# Patient Record
Sex: Female | Born: 1985 | Race: Black or African American | Hispanic: No | Marital: Single | Smoking: Former smoker
Health system: Southern US, Community
[De-identification: ages and names within clinical notes are randomized; demographics above are authoritative.]

## PROBLEM LIST (undated history)

## (undated) DIAGNOSIS — O139 Gestational [pregnancy-induced] hypertension without significant proteinuria, unspecified trimester: Secondary | ICD-10-CM

## (undated) DIAGNOSIS — Z789 Other specified health status: Secondary | ICD-10-CM

## (undated) HISTORY — DX: Other specified health status: Z78.9

## (undated) HISTORY — DX: Gestational (pregnancy-induced) hypertension without significant proteinuria, unspecified trimester: O13.9

## (undated) HISTORY — PX: DILATION AND CURETTAGE OF UTERUS: SHX78

---
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
## 2006-01-16 ENCOUNTER — Emergency Department (HOSPITAL_COMMUNITY): Admission: EM | Admit: 2006-01-16 | Discharge: 2006-01-16 | Payer: Self-pay | Admitting: Emergency Medicine

## 2006-01-17 ENCOUNTER — Ambulatory Visit (HOSPITAL_COMMUNITY): Admission: RE | Admit: 2006-01-17 | Discharge: 2006-01-17 | Payer: Self-pay | Admitting: Emergency Medicine

## 2006-02-02 ENCOUNTER — Encounter (INDEPENDENT_AMBULATORY_CARE_PROVIDER_SITE_OTHER): Payer: Self-pay | Admitting: *Deleted

## 2006-02-02 ENCOUNTER — Emergency Department (HOSPITAL_COMMUNITY): Admission: EM | Admit: 2006-02-02 | Discharge: 2006-02-02 | Payer: Self-pay | Admitting: Emergency Medicine

## 2006-10-12 ENCOUNTER — Emergency Department (HOSPITAL_COMMUNITY): Admission: EM | Admit: 2006-10-12 | Discharge: 2006-10-12 | Payer: Self-pay | Admitting: Emergency Medicine

## 2007-07-29 ENCOUNTER — Emergency Department (HOSPITAL_COMMUNITY): Admission: EM | Admit: 2007-07-29 | Discharge: 2007-07-29 | Payer: Self-pay | Admitting: Emergency Medicine

## 2007-12-31 IMAGING — US US OB COMP LESS 14 WK
1 series · 14 of 28 positions shown · non-contrast
Comparison: none

HISTORY: Bleeding and cramping, pregnant, 6 weeks 2 days EGA by LMP

[Series 1: us ob comp less 14 wk · 0.21mm/px · 14 of 69 slices shown]
[im 3/69]
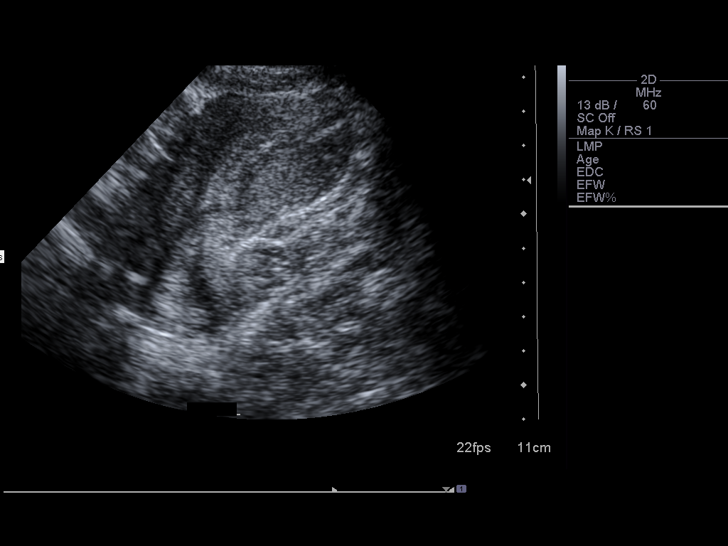
[im 8/69]
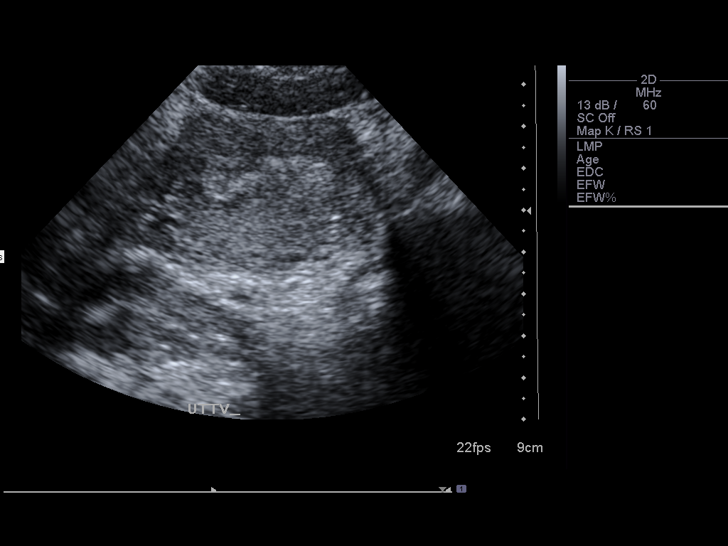
[im 13/69]
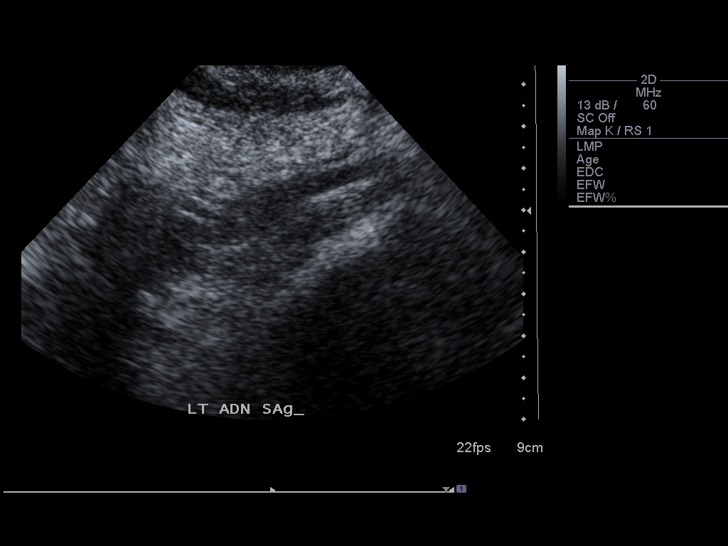
[im 18/69]
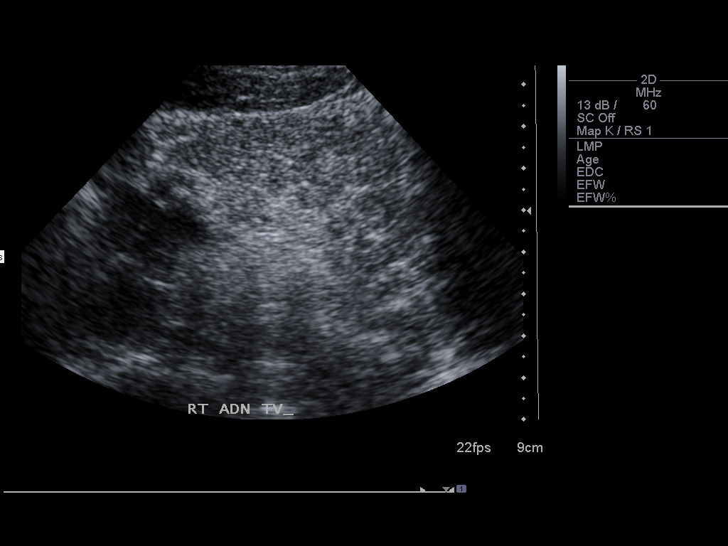
[im 23/69]
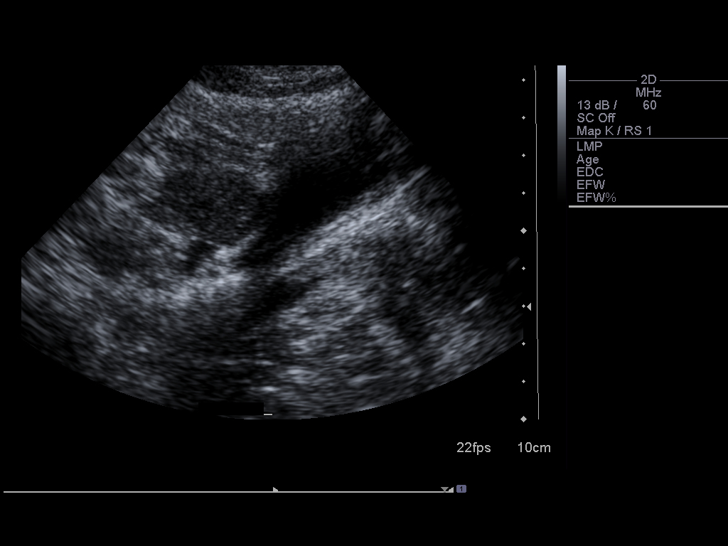
[im 28/69]
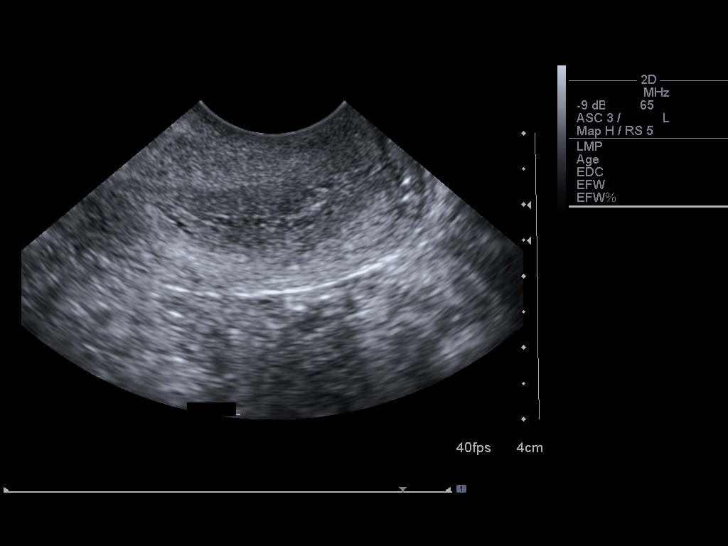
[im 33/69]
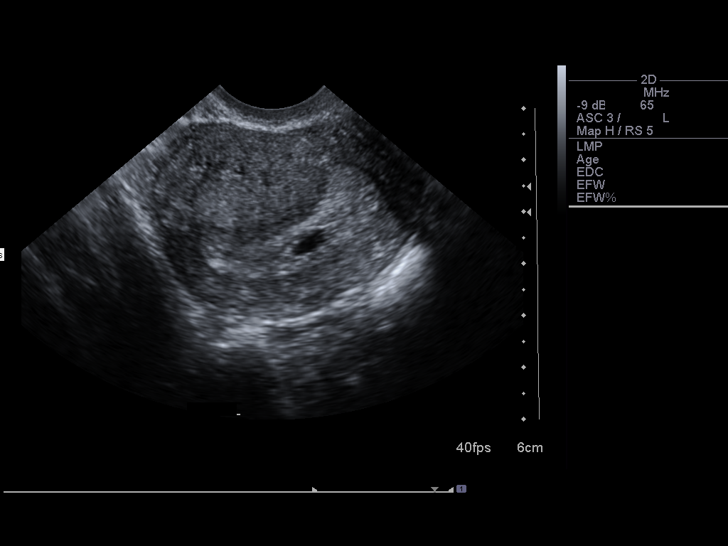
[im 38/69]
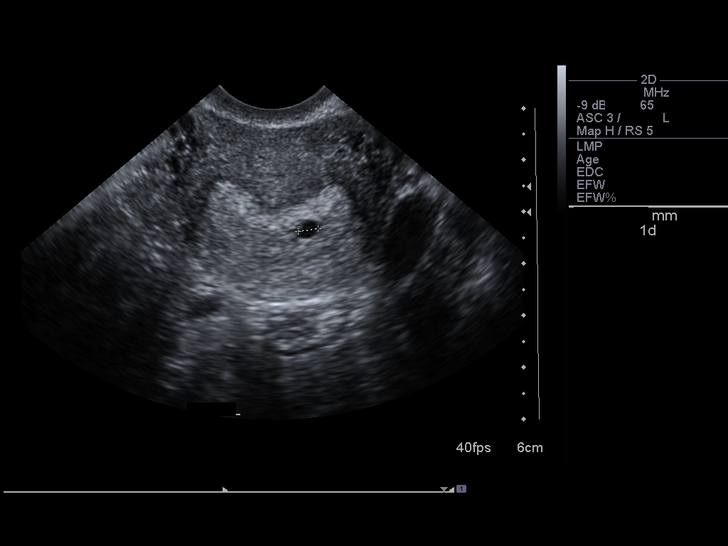
[im 43/69]
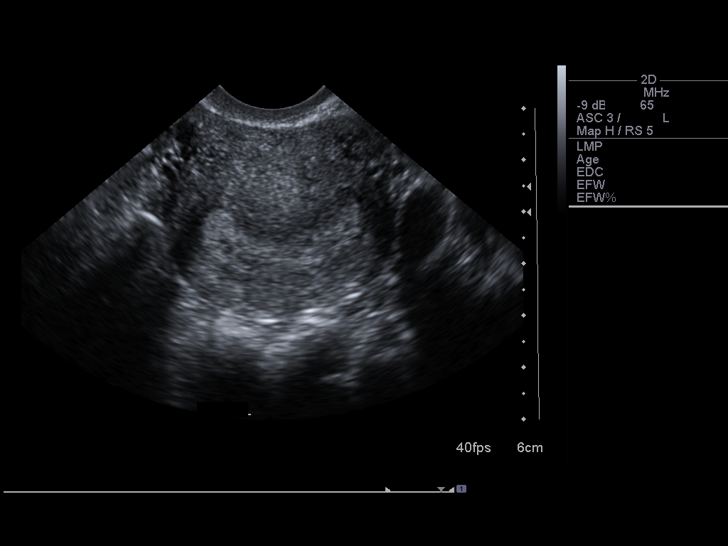
[im 48/69]
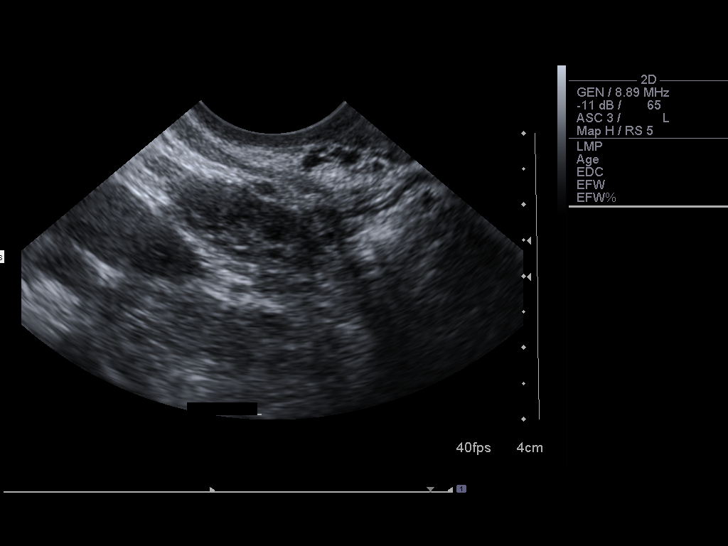
[im 53/69]
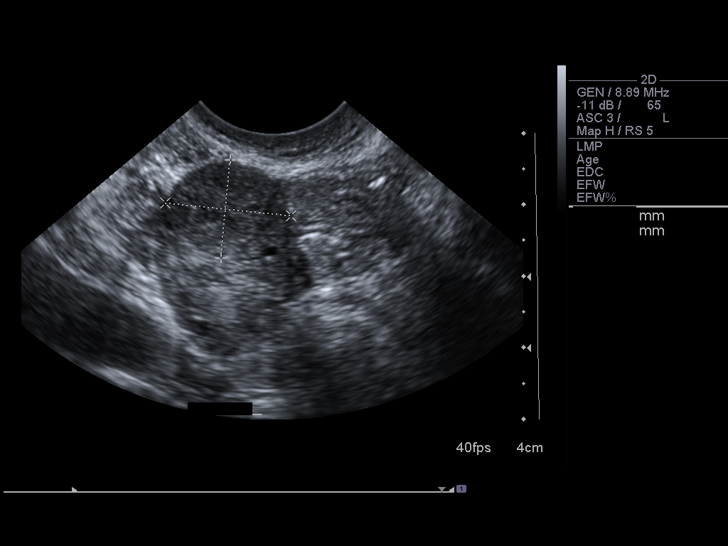
[im 58/69]
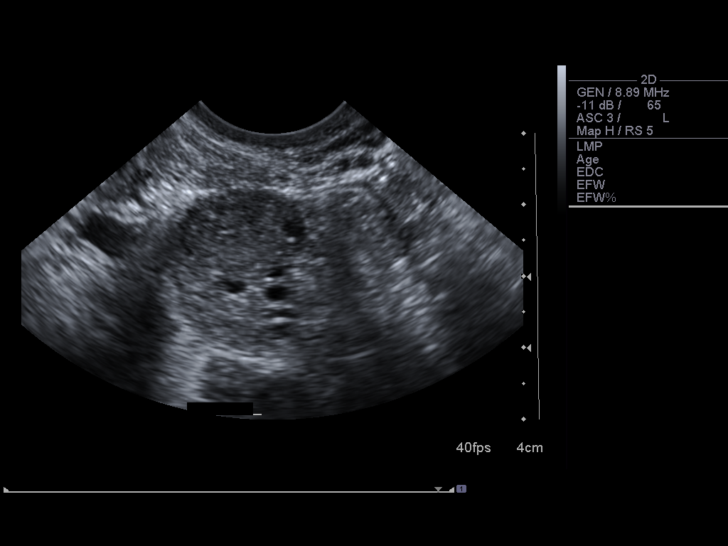
[im 63/69]
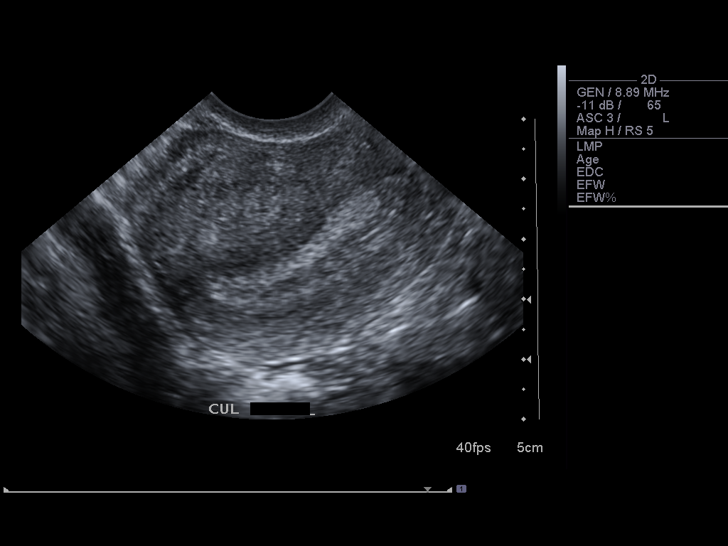
[im 69/69]
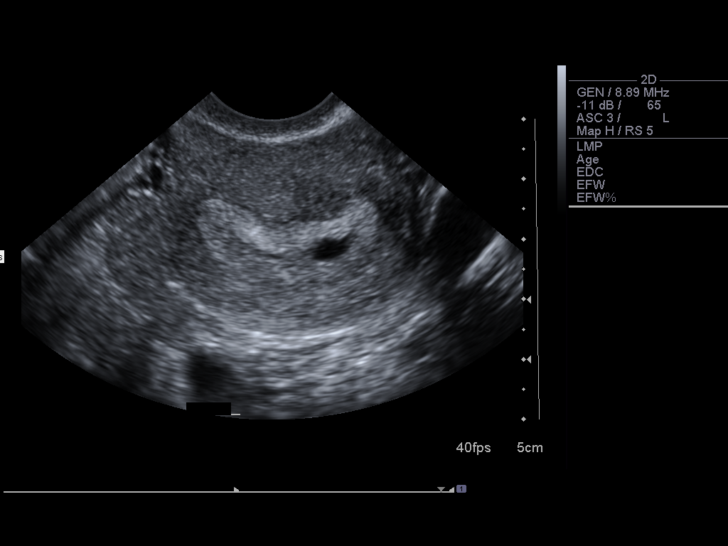

[14 of 28 positions shown; findings below may reference images not displayed]

ULTRASOUND OB COMPLETE LESS THAN 14 WEEKS:
ULTRASOUND OB TRANSVAGINAL MODIFY:

Transabdominal and endovaginal sonography of the gravid pelvis performed.

Tiny gestational sac seen within uterus.
Mean sac diameter 3.3 mm, corresponding to gestational age 5 weeks 0 days.
No fetal pole nor yolk sac visible.
No subchorionic hemorrhage.
Left ovary normal size and morphology, 1.8 x 2.8 x 1.4 cm.
Right ovary larger at 2.8 x 2.0 x 1.9 cm.
Small hemorrhagic corpus luteal cyst right ovary 1.8 x 1.7 x 1.4 cm in size.
No free pelvic fluid or adnexal mass.
IMPRESSION: Tiny gestational sac within uterus corresponding to 5 weeks 0 days EGA.
No fetal pole nor yolk sac identified, unable to assess viability.
Followup ultrasound recommended after 7 to 10 days to assess for fetal
viability.

## 2008-01-29 ENCOUNTER — Other Ambulatory Visit: Admission: RE | Admit: 2008-01-29 | Discharge: 2008-01-29 | Payer: Self-pay | Admitting: Obstetrics and Gynecology

## 2008-07-07 ENCOUNTER — Inpatient Hospital Stay (HOSPITAL_COMMUNITY): Admission: AD | Admit: 2008-07-07 | Discharge: 2008-07-10 | Payer: Self-pay | Admitting: Obstetrics & Gynecology

## 2008-07-07 ENCOUNTER — Ambulatory Visit: Payer: Self-pay | Admitting: Obstetrics and Gynecology

## 2008-07-08 DIAGNOSIS — O4290 Premature rupture of membranes, unspecified as to length of time between rupture and onset of labor, unspecified weeks of gestation: Secondary | ICD-10-CM

## 2009-01-06 ENCOUNTER — Emergency Department (HOSPITAL_COMMUNITY): Admission: EM | Admit: 2009-01-06 | Discharge: 2009-01-06 | Payer: Self-pay | Admitting: Emergency Medicine

## 2009-02-20 ENCOUNTER — Emergency Department (HOSPITAL_COMMUNITY): Admission: EM | Admit: 2009-02-20 | Discharge: 2009-02-20 | Payer: Self-pay | Admitting: Emergency Medicine

## 2009-07-01 ENCOUNTER — Emergency Department (HOSPITAL_COMMUNITY): Admission: EM | Admit: 2009-07-01 | Discharge: 2009-07-01 | Payer: Self-pay | Admitting: Emergency Medicine

## 2009-11-07 ENCOUNTER — Emergency Department (HOSPITAL_COMMUNITY): Admission: EM | Admit: 2009-11-07 | Discharge: 2009-11-07 | Payer: Self-pay | Admitting: Emergency Medicine

## 2009-11-19 ENCOUNTER — Ambulatory Visit: Payer: Self-pay | Admitting: Family Medicine

## 2009-11-19 ENCOUNTER — Encounter: Payer: Self-pay | Admitting: Family Medicine

## 2009-11-19 ENCOUNTER — Inpatient Hospital Stay (HOSPITAL_COMMUNITY): Admission: AD | Admit: 2009-11-19 | Discharge: 2009-11-21 | Payer: Self-pay | Admitting: Family Medicine

## 2010-03-13 ENCOUNTER — Emergency Department (HOSPITAL_COMMUNITY)
Admission: EM | Admit: 2010-03-13 | Discharge: 2010-03-13 | Disposition: A | Discharge status: Home / Self Care | Payer: Self-pay | Attending: Emergency Medicine | Admitting: Emergency Medicine

## 2010-03-13 DIAGNOSIS — R300 Dysuria: Secondary | ICD-10-CM | POA: Insufficient documentation

## 2010-03-13 DIAGNOSIS — N390 Urinary tract infection, site not specified: Secondary | ICD-10-CM | POA: Insufficient documentation

## 2010-03-13 DIAGNOSIS — R319 Hematuria, unspecified: Secondary | ICD-10-CM | POA: Insufficient documentation

## 2010-03-13 LAB — URINALYSIS, ROUTINE W REFLEX MICROSCOPIC
Glucose, UA: NEGATIVE mg/dL
Protein, ur: NEGATIVE mg/dL
Urobilinogen, UA: 0.2 mg/dL (ref 0.0–1.0)
pH: 6 (ref 5.0–8.0)

## 2010-03-13 LAB — URINE MICROSCOPIC-ADD ON

## 2010-03-13 LAB — PREGNANCY, URINE: Preg Test, Ur: NEGATIVE

## 2010-03-15 LAB — URINALYSIS, ROUTINE W REFLEX MICROSCOPIC
Glucose, UA: NEGATIVE mg/dL
Ketones, ur: NEGATIVE mg/dL
Leukocytes, UA: NEGATIVE
Protein, ur: NEGATIVE mg/dL
Specific Gravity, Urine: 1.02 (ref 1.005–1.030)
Urobilinogen, UA: 1 mg/dL (ref 0.0–1.0)

## 2010-03-15 LAB — WET PREP, GENITAL: Yeast Wet Prep HPF POC: NONE SEEN

## 2010-03-15 LAB — GC/CHLAMYDIA PROBE AMP, GENITAL: Chlamydia, DNA Probe: NEGATIVE

## 2010-03-15 LAB — STREP B DNA PROBE

## 2010-03-15 LAB — CBC
MCH: 26.6 pg (ref 26.0–34.0)
MCHC: 33 g/dL (ref 30.0–36.0)
MCV: 80.5 fL (ref 78.0–100.0)
Platelets: 191 10*3/uL (ref 150–400)
RBC: 4.47 MIL/uL (ref 3.87–5.11)

## 2010-03-15 LAB — URINE MICROSCOPIC-ADD ON

## 2010-03-15 LAB — ABO/RH: ABO/RH(D): O POS

## 2010-03-16 LAB — URINE CULTURE: Culture  Setup Time: 201203121210

## 2010-03-20 LAB — URINALYSIS, ROUTINE W REFLEX MICROSCOPIC
Glucose, UA: NEGATIVE mg/dL
Ketones, ur: NEGATIVE mg/dL
Nitrite: NEGATIVE
Protein, ur: NEGATIVE mg/dL

## 2010-03-20 LAB — URINE MICROSCOPIC-ADD ON

## 2010-03-20 LAB — URINE CULTURE: Colony Count: NO GROWTH

## 2010-03-23 LAB — GC/CHLAMYDIA PROBE AMP, GENITAL: Chlamydia, DNA Probe: NEGATIVE

## 2010-03-23 LAB — URINALYSIS, ROUTINE W REFLEX MICROSCOPIC
Glucose, UA: NEGATIVE mg/dL
Protein, ur: NEGATIVE mg/dL
Specific Gravity, Urine: 1.015 (ref 1.005–1.030)

## 2010-03-23 LAB — URINE MICROSCOPIC-ADD ON

## 2010-03-23 LAB — HERPES SIMPLEX VIRUS CULTURE: Culture: DETECTED

## 2010-03-24 ENCOUNTER — Emergency Department (HOSPITAL_COMMUNITY)
Admission: EM | Admit: 2010-03-24 | Discharge: 2010-03-24 | Disposition: A | Discharge status: Home / Self Care | Payer: Self-pay | Attending: Emergency Medicine | Admitting: Emergency Medicine

## 2010-03-24 DIAGNOSIS — F17200 Nicotine dependence, unspecified, uncomplicated: Secondary | ICD-10-CM | POA: Insufficient documentation

## 2010-03-24 DIAGNOSIS — N390 Urinary tract infection, site not specified: Secondary | ICD-10-CM | POA: Insufficient documentation

## 2010-03-24 LAB — POCT PREGNANCY, URINE: Preg Test, Ur: NEGATIVE

## 2010-03-24 LAB — URINALYSIS, ROUTINE W REFLEX MICROSCOPIC
Ketones, ur: NEGATIVE mg/dL
Nitrite: NEGATIVE
Specific Gravity, Urine: >1.03 — ABNORMAL HIGH (ref 1.005–1.030)
pH: 6 (ref 5.0–8.0)

## 2010-03-24 LAB — URINE MICROSCOPIC-ADD ON

## 2010-03-26 LAB — URINE CULTURE

## 2010-04-10 LAB — STREP B DNA PROBE

## 2010-04-10 LAB — CBC
HCT: 33.7 % — ABNORMAL LOW (ref 36.0–46.0)
HCT: 34.5 % — ABNORMAL LOW (ref 36.0–46.0)
Hemoglobin: 11.3 g/dL — ABNORMAL LOW (ref 12.0–15.0)
Hemoglobin: 11.5 g/dL — ABNORMAL LOW (ref 12.0–15.0)
MCHC: 33.6 g/dL (ref 30.0–36.0)
MCV: 78.3 fL (ref 78.0–100.0)
MCV: 78.5 fL (ref 78.0–100.0)
Platelets: 203 10*3/uL (ref 150–400)
RDW: 13.6 % (ref 11.5–15.5)
WBC: 11.5 10*3/uL — ABNORMAL HIGH (ref 4.0–10.5)

## 2010-05-17 NOTE — Op Note (Signed)
Debra Glass, Debra Glass NO.:  [id number]   MEDICAL RECORD NO.:  [id number]          PATIENT TYPE:  INP   LOCATION:  9118                          FACILITY:  [facility]   PHYSICIAN:  Tilda Burrow, M.D. DATE OF BIRTH:  [birth year]   DATE OF PROCEDURE:  07/08/2008  DATE OF DISCHARGE:                               OPERATIVE REPORT   LABOR SUMMARY AND DELIVERY NOTE:  [name] was admitted early on July 07, 2008, with preterm rupture of membranes confirmed.  She had no  contraction, slight cramping, no bleeding.  Membrane rupture was  confirmed at 35 weeks' gestation.  Foley bulb was inserted.  IV  antibiotic prophylaxis due to prematurity was initiated with penicillin.  The patient progressed only slowly with the cervix.  She had cervical  progression with a Foley bulb to 3-4 cm at 5:45 a.m. on July 08, 2008,  and throughout the day, multiple efforts at stretching the cervix were  performed.  Cervix stayed at 4 cm, completely effaced, -1, descended  steadily through the day to 0 to +1 with a tight fibrotic ring, status  post her prior LEEP conization of cervix.  Efforts to disrupt it  included digital exam, speculum exam with ring forceps and so by 5  o'clock, she was 4 cm, 90%, 0 station.  Repeat efforts by me to stretch  her cervix at 5:15 p.m. resulted in disruption of some of the fibrotic  tissues with cervix stretched to 5-6 cm.  Over the next hour and a half,  multiple serial stretchings and exams continued to make steady dilation  of the cervix.  The patient did not tolerate Pitocin due to recurrent  variable decelerations on an intermittent basis.  These were usually  every 30 minutes or less but were quite severe in the 40-60 range with  good recovery and beat-to-beat variability between them.  She was  allowed to progress slowly after 9 cm at 7:05 p.m.  She finally reached  a reducible anterior lip, shortly after 8 p.m.  She had a strong urge to  push but with  pushing, began to have recurrent deep variables with each  contraction.  She was told not to push and taken to the operating room  for vacuum assistance attempt.  She had been found to be in occiput  posterior presentation when she reached reducible anterior lip.  Ultrasound was performed to confirm the fetal body being on the right  side and vertex in an occiput posterior presentation prior to transfer  from the room.  Upon arrival in the OR, the patient had further urge to  push and was found to be crowning and having rotated into an occiput  anterior position.  She progressed quickly with a spontaneous vertex  vaginal delivery of a healthy female infant with Apgars __________ and  9, delivered over an intact perineum with nuchal cord x1, released for  delivery of the fetal body.  The infant was placed under the care of  Pediatrics and Para March presentation of the placenta upon delivery was  noted.  EBL at delivery less than 250 mL,  fundus firm at U-2 after  delivery.      Tilda Burrow, M.D.  Electronically Signed     JVF/MEDQ  D:  07/08/2008  T:  07/09/2008  Job:  [id number]   cc:   [facility]

## 2010-08-26 ENCOUNTER — Encounter: Payer: Self-pay | Admitting: *Deleted

## 2010-08-26 ENCOUNTER — Emergency Department (HOSPITAL_COMMUNITY)
Admission: EM | Admit: 2010-08-26 | Discharge: 2010-08-26 | Disposition: A | Discharge status: Home / Self Care | Payer: Self-pay | Attending: Emergency Medicine | Admitting: Emergency Medicine

## 2010-08-26 ENCOUNTER — Emergency Department (HOSPITAL_COMMUNITY): Payer: Self-pay

## 2010-08-26 DIAGNOSIS — X500XXA Overexertion from strenuous movement or load, initial encounter: Secondary | ICD-10-CM | POA: Insufficient documentation

## 2010-08-26 DIAGNOSIS — S335XXA Sprain of ligaments of lumbar spine, initial encounter: Secondary | ICD-10-CM | POA: Insufficient documentation

## 2010-08-26 MED ORDER — CYCLOBENZAPRINE HCL 10 MG PO TABS: 10.0000 mg | ORAL_TABLET | Freq: Three times a day (TID) | ORAL | Status: AC | PRN

## 2010-08-26 MED ORDER — HYDROCODONE-ACETAMINOPHEN 5-325 MG PO TABS: ORAL_TABLET | ORAL | Status: AC

## 2010-08-26 NOTE — ED Notes (Addendum)
Pt c/o lower back pain x 5 days. Denies injury. Denies urinary symptoms.

## 2010-08-26 NOTE — ED Notes (Deleted)
Pt c/o lower back pain that got worse this am. Pt states that it has been hurting since March. Denies injury.

## 2010-08-26 NOTE — ED Provider Notes (Signed)
History     CSN: [id number] Arrival date & time: 08/26/2010 12:23 PM  Chief Complaint  Patient presents with  . Back Pain   Patient is a 25 y.o. female presenting with back pain. The history is provided by the patient.  Back Pain  This is a new problem. The current episode started more than 2 days ago. The problem occurs constantly. The problem has been gradually worsening. The pain is associated with lifting heavy objects. The pain is present in the lumbar spine. The quality of the pain is described as aching and stabbing. The pain does not radiate. The pain is moderate. The symptoms are aggravated by bending and twisting. The pain is the same all the time. Pertinent negatives include no chest pain, no fever, no numbness, no abdominal pain, no abdominal swelling, no bowel incontinence, no perianal numbness, no bladder incontinence, no dysuria, no pelvic pain, no leg pain, no paresthesias, no tingling and no weakness. She has tried nothing for the symptoms. The treatment provided no relief.    History reviewed. No pertinent past medical history.  History reviewed. No pertinent past surgical history.  History reviewed. No pertinent family history.  History  Substance Use Topics  . Smoking status: Current Everyday Smoker  . Smokeless tobacco: Not on file  . Alcohol Use: Yes     occasionally    OB History    Grav Para Term Preterm Abortions TAB SAB Ect Mult Living                  Review of Systems  Constitutional: Negative for fever.  Cardiovascular: Negative for chest pain.  Gastrointestinal: Negative for abdominal pain and bowel incontinence.  Genitourinary: Negative for bladder incontinence, dysuria and pelvic pain.  Musculoskeletal: Positive for back pain. Negative for myalgias, arthralgias and gait problem.  Skin: Negative.   Neurological: Negative for dizziness, tingling, weakness, numbness and paresthesias.  Hematological: Does not bruise/bleed easily.  All other  systems reviewed and are negative.    Physical Exam  BP 146/81  Pulse 88  Temp(Src) 98.3 F (36.8 C) (Oral)  Resp 18  Ht 5\' 6"  (1.676 m)  Wt 139 lb (63.05 kg)  BMI 22.44 kg/m2  SpO2 100%  Physical Exam  Nursing note and vitals reviewed. Constitutional: She is oriented to person, place, and time. She appears well-developed and well-nourished.  HENT:  Head: Normocephalic and atraumatic.  Mouth/Throat: Oropharynx is clear and moist.  Eyes: EOM are normal. Pupils are equal, round, and reactive to light.  Neck: Normal range of motion. Neck supple.  Cardiovascular: Normal rate, regular rhythm and normal heart sounds.   Pulmonary/Chest: Effort normal and breath sounds normal.  Abdominal: Soft. There is no tenderness. There is no rebound and no guarding.  Musculoskeletal: She exhibits tenderness. She exhibits no edema.       Lumbar back: She exhibits tenderness and pain. She exhibits normal range of motion, no swelling, no spasm and normal pulse.       Back:  Lymphadenopathy:    She has no cervical adenopathy.  Neurological: She is alert and oriented to person, place, and time. She has normal reflexes. No cranial nerve deficit. She exhibits normal muscle tone. Coordination normal.  Skin: Skin is warm and dry.    ED Course  Procedures  MDM    Results for orders placed during the hospital encounter of 03/24/10  POCT PREGNANCY, URINE      Component Value Range   Preg Test, Ur  Value: NEGATIVE            THE SENSITIVITY OF THIS     METHODOLOGY IS >24 mIU/mL  URINALYSIS, ROUTINE W REFLEX MICROSCOPIC      Component Value Range   Color, Urine YELLOW  YELLOW    Appearance HAZY (*) CLEAR    Specific Gravity, Urine >1.030 (*) 1.005 - 1.030    pH 6.0  5.0 - 8.0    Glucose, UA NEGATIVE  NEGATIVE (mg/dL)   Hgb urine dipstick MODERATE (*) NEGATIVE    Bilirubin Urine NEGATIVE  NEGATIVE    Ketones, ur NEGATIVE  NEGATIVE (mg/dL)   Protein, ur 30 (*) NEGATIVE (mg/dL)    Urobilinogen, UA 0.2  0.0 - 1.0 (mg/dL)   Nitrite NEGATIVE  NEGATIVE    Leukocytes, UA SMALL (*) NEGATIVE   URINE MICROSCOPIC-ADD ON      Component Value Range   Squamous Epithelial / LPF RARE  RARE    WBC, UA TOO NUMEROUS TO COUNT  <3 (WBC/hpf)   RBC / HPF 3-6  <3 (RBC/hpf)   Bacteria, UA FEW (*) RARE   URINE CULTURE      Component Value Range   Specimen Description URINE, CLEAN CATCH     Special Requests NONE     Setup Time [id number]     Colony Count >=100,000 COLONIES/ML     Culture       Value: STAPHYLOCOCCUS SPECIES (COAGULASE NEGATIVE)     Note: RIFAMPIN AND GENTAMICIN SHOULD NOT BE USED AS SINGLE DRUGS FOR TREATMENT OF STAPH INFECTIONS.   Report Status 03/26/2010 FINAL     Organism ID, Bacteria STAPHYLOCOCCUS SPECIES (COAGULASE NEGATIVE)       Dg Lumbar Spine Complete  08/26/2010  *RADIOLOGY REPORT*  Clinical Data: Back pain for 4 days  LUMBAR SPINE - COMPLETE 4+ VIEW  Comparison: None  Findings: Five non-rib bearing lumbar vertebrae. Osseous mineralization normal. Vertebral body and disc space heights maintained. No acute fracture, subluxation or bone destruction. No spondylolysis. SI joints symmetric.  IMPRESSION: Normal exam.  Original Report Authenticated By: Lollie Marrow, M.D.         The patient appears reasonably screened and/or stabilized for discharge and I doubt any other medical condition or other [facility] requiring further screening, evaluation, or treatment in the ED at this time prior to discharge.       Dacari Beckstrand L. Nolton Denis, [place] 08/26/10 1421

## 2010-08-26 NOTE — ED Notes (Signed)
Pt a/ox4. Resp even and unlabored. NAD at this time. D/C instructions reviewed with pt. Pt verbalized understanding. Pt ambulated with steady gate to POV. 

## 2010-09-07 NOTE — ED Provider Notes (Signed)
Medical screening examination/treatment/procedure(s) were conducted as a shared visit with non-physician practitioner(s) and myself.  I personally evaluated the patient during the encounter  Adraine Biffle T Shawnise Peterkin, MD 09/07/10 2332 

## 2010-09-30 LAB — URINALYSIS, ROUTINE W REFLEX MICROSCOPIC
Bilirubin Urine: NEGATIVE
Glucose, UA: NEGATIVE
Ketones, ur: 15 — AB
Protein, ur: NEGATIVE

## 2010-09-30 LAB — URINE MICROSCOPIC-ADD ON

## 2010-09-30 LAB — WET PREP, GENITAL
WBC, Wet Prep HPF POC: NONE SEEN
Yeast Wet Prep HPF POC: NONE SEEN

## 2010-10-13 LAB — GC/CHLAMYDIA PROBE AMP, GENITAL: Chlamydia, DNA Probe: NEGATIVE

## 2010-10-13 LAB — PREGNANCY, URINE: Preg Test, Ur: NEGATIVE

## 2010-10-13 LAB — URINALYSIS, ROUTINE W REFLEX MICROSCOPIC
Ketones, ur: NEGATIVE
Nitrite: NEGATIVE
Specific Gravity, Urine: 1.025
Urobilinogen, UA: 1
pH: 6.5

## 2010-10-13 LAB — WET PREP, GENITAL

## 2011-08-08 ENCOUNTER — Other Ambulatory Visit: Payer: Self-pay | Admitting: Nurse Practitioner

## 2011-08-08 ENCOUNTER — Other Ambulatory Visit (HOSPITAL_COMMUNITY)
Admission: RE | Admit: 2011-08-08 | Discharge: 2011-08-08 | Disposition: A | Discharge status: Home / Self Care | Payer: Self-pay | Source: Ambulatory Visit | Attending: Unknown Physician Specialty | Admitting: Unknown Physician Specialty

## 2011-08-08 DIAGNOSIS — R87610 Atypical squamous cells of undetermined significance on cytologic smear of cervix (ASC-US): Secondary | ICD-10-CM | POA: Insufficient documentation

## 2011-08-08 DIAGNOSIS — N870 Mild cervical dysplasia: Secondary | ICD-10-CM | POA: Insufficient documentation

## 2011-12-01 ENCOUNTER — Encounter (HOSPITAL_COMMUNITY): Payer: Self-pay | Admitting: Emergency Medicine

## 2011-12-01 ENCOUNTER — Emergency Department (HOSPITAL_COMMUNITY)
Admission: EM | Admit: 2011-12-01 | Discharge: 2011-12-01 | Disposition: A | Discharge status: Home / Self Care | Payer: Self-pay | Attending: Emergency Medicine | Admitting: Emergency Medicine

## 2011-12-01 ENCOUNTER — Emergency Department (HOSPITAL_COMMUNITY): Payer: Self-pay

## 2011-12-01 DIAGNOSIS — F17200 Nicotine dependence, unspecified, uncomplicated: Secondary | ICD-10-CM | POA: Insufficient documentation

## 2011-12-01 DIAGNOSIS — Z79899 Other long term (current) drug therapy: Secondary | ICD-10-CM | POA: Insufficient documentation

## 2011-12-01 DIAGNOSIS — M79609 Pain in unspecified limb: Secondary | ICD-10-CM | POA: Insufficient documentation

## 2011-12-01 DIAGNOSIS — M79671 Pain in right foot: Secondary | ICD-10-CM

## 2011-12-01 NOTE — ED Provider Notes (Signed)
Medical screening examination/treatment/procedure(s) were performed by non-physician practitioner and as supervising physician I was immediately available for consultation/collaboration.   Owais Pruett, MD 12/01/11 1514 

## 2011-12-01 NOTE — ED Notes (Signed)
Pt c/o r anterior foot pain since yesterday. No pain unless walking or bending. Denies injury. Nad. No swelling or deformity noted. Pedal pulse present.

## 2011-12-01 NOTE — ED Provider Notes (Signed)
History     CSN: [id number]  Arrival date & time 12/01/11  1309   None     Chief Complaint  Patient presents with  . Foot Pain    (Consider location/radiation/quality/duration/timing/severity/associated sxs/prior treatment) HPI Comments: Thinks she may have hit her R foot on something.  Hurts to stand and walk  Patient is a 26 y.o. female presenting with lower extremity pain. The history is provided by the patient. No language interpreter was used.  Foot Pain This is a new problem. The current episode started yesterday. The problem occurs constantly. The symptoms are aggravated by standing and walking. Treatments tried: aspirin. The treatment provided no relief.    History reviewed. No pertinent past medical history.  History reviewed. No pertinent past surgical history.  History reviewed. No pertinent family history.  History  Substance Use Topics  . Smoking status: Current Every Day Smoker  . Smokeless tobacco: Not on file  . Alcohol Use: Yes     Comment: occasionally    OB History    Grav Para Term Preterm Abortions TAB SAB Ect Mult Living                  Review of Systems  Musculoskeletal:       Foot pain   Skin: Negative for wound.  All other systems reviewed and are negative.    Allergies  Review of patient's allergies indicates no known allergies.  Home Medications   Current Outpatient Rx  Name  Route  Sig  Dispense  Refill  . BC FAST PAIN RELIEF PO   Oral   Take 1 packet by mouth every 6 (six) hours. Back pain         . MEDROXYPROGESTERONE ACETATE 150 MG/ML IM SUSP   Intramuscular   Inject 150 mg into the muscle every 3 (three) months.         . ADULT MULTIVITAMIN W/MINERALS CH   Oral   Take 1 tablet by mouth daily.           BP 136/81  Pulse 99  Temp 98.5 F (36.9 C) (Oral)  Resp 16  Ht 5\' 5"  (1.651 m)  Wt 136 lb (61.689 kg)  BMI 22.63 kg/m2  SpO2 100%  Physical Exam  Nursing note and vitals reviewed. Constitutional:  She is oriented to person, place, and time. She appears well-developed and well-nourished. No distress.  HENT:  Head: Normocephalic and atraumatic.  Eyes: EOM are normal.  Neck: Normal range of motion.  Cardiovascular: Normal rate and regular rhythm.   Pulmonary/Chest: Effort normal.  Abdominal: Soft. She exhibits no distension. There is no tenderness.  Musculoskeletal: She exhibits tenderness.       Right foot: She exhibits decreased range of motion and tenderness. She exhibits no swelling, no crepitus, no deformity and no laceration.       Feet:  Neurological: She is alert and oriented to person, place, and time.  Skin: Skin is warm and dry.  Psychiatric: She has a normal mood and affect. Judgment normal.    ED Course  Procedures (including critical care time)  Labs Reviewed - No data to display Dg Foot Complete Right  12/01/2011  *RADIOLOGY REPORT*  Clinical Data: Foot pain  RIGHT FOOT COMPLETE - 3+ VIEW  Comparison: None.  Findings: Three views of the right foot submitted.  No acute fracture or subluxation.  No radiopaque foreign body.  IMPRESSION: No acute fracture or subluxation.   Original Report Authenticated By: Natasha Mead, M.D.  1. Right foot pain       MDM  Ibuprofen 600 mg TID Post op shoe Crutches Ice, elevation F/u dr. Hilda Lias prn        Evalina Field, PA 12/01/11 1421

## 2012-08-08 IMAGING — CR DG LUMBAR SPINE COMPLETE 4+V
5 series · 5 of 5 positions shown · non-contrast
Comparison: None

CLINICAL DATA: Back pain for 4 days

LUMBAR SPINE - COMPLETE 4+ VIEW

[view not recorded (1 of 5)]
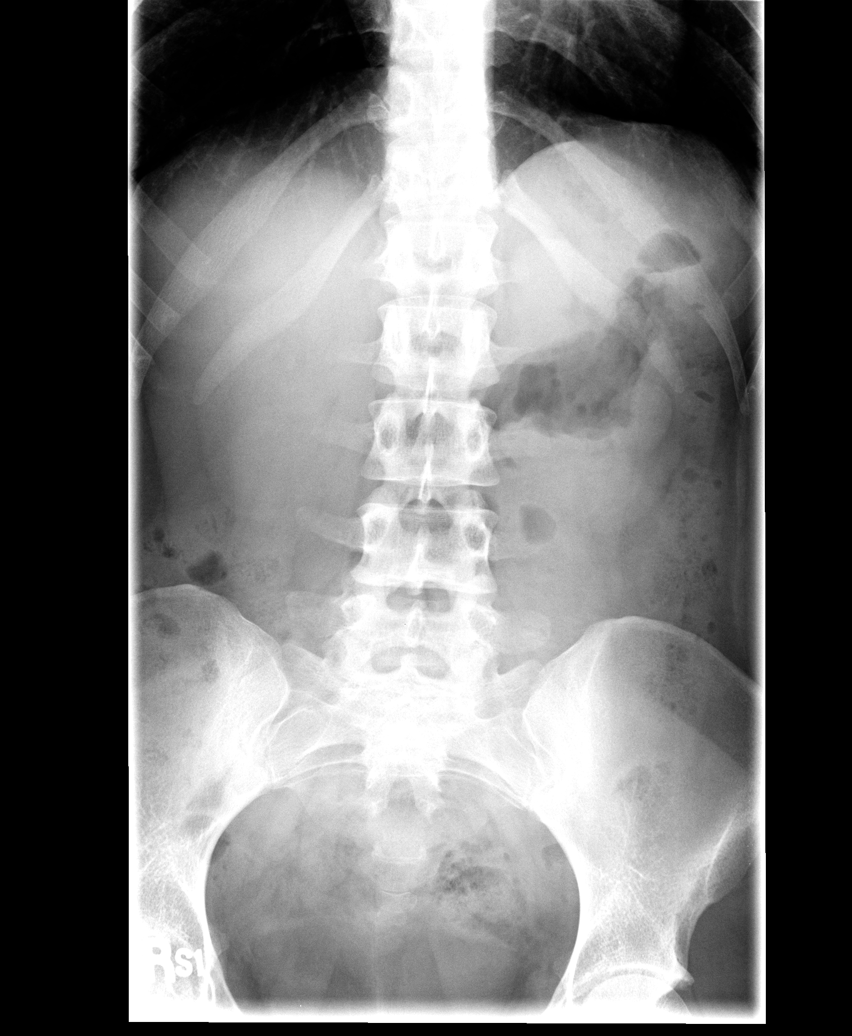

[view not recorded (2 of 5)]
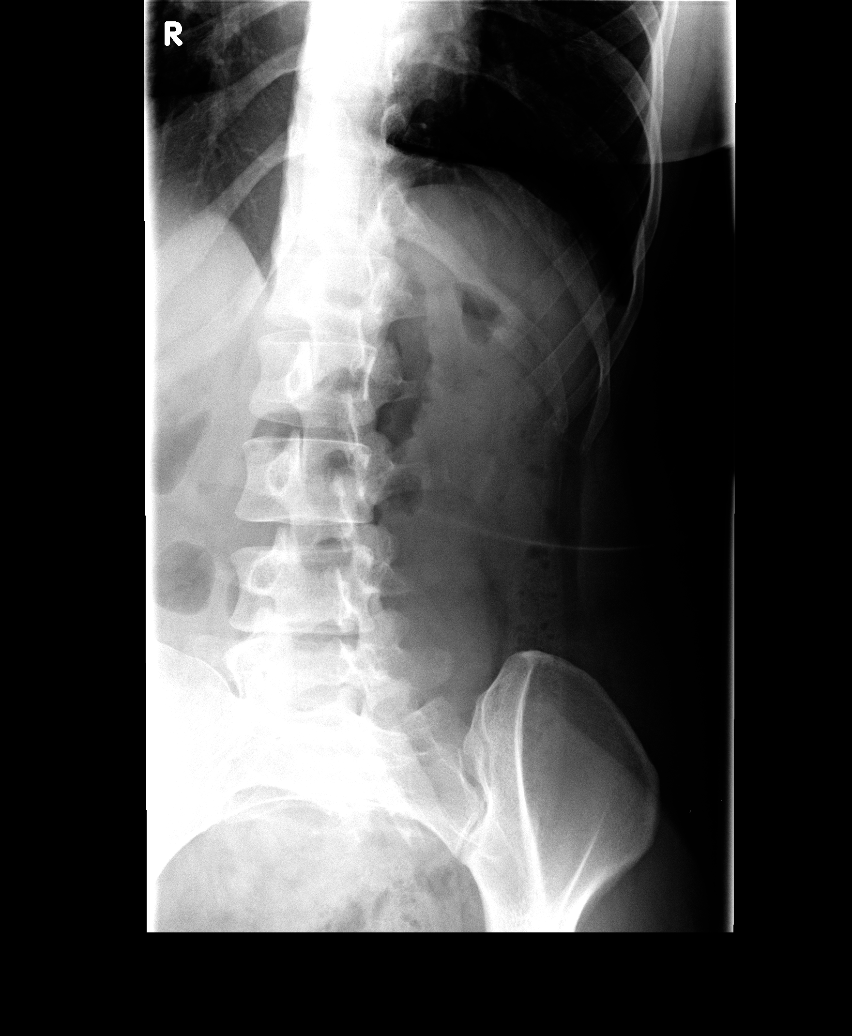

[view not recorded (3 of 5)]
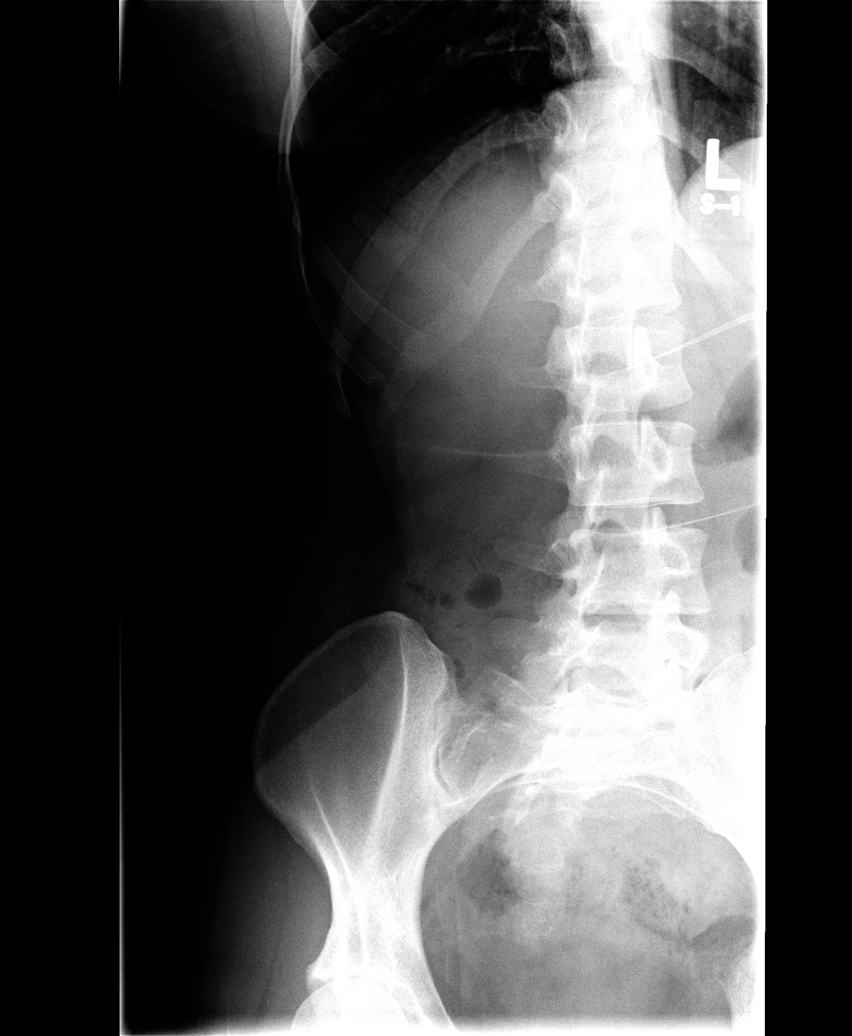

[view not recorded (4 of 5)]
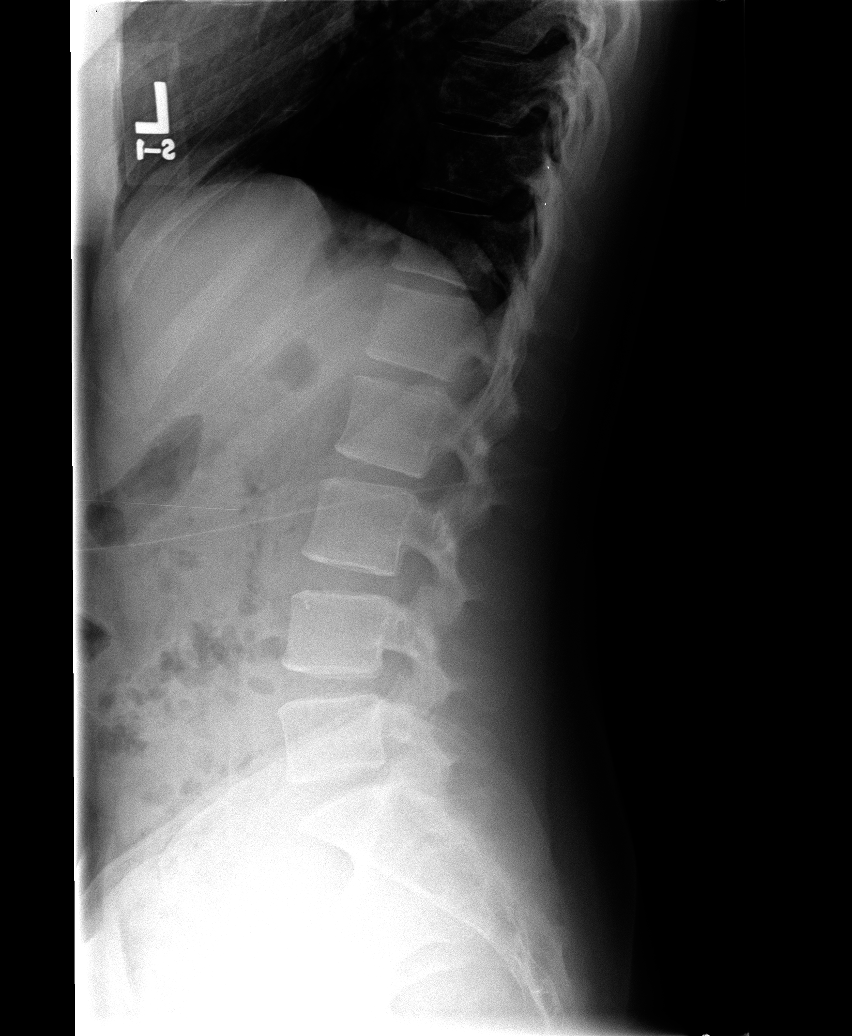

[view not recorded (5 of 5)]
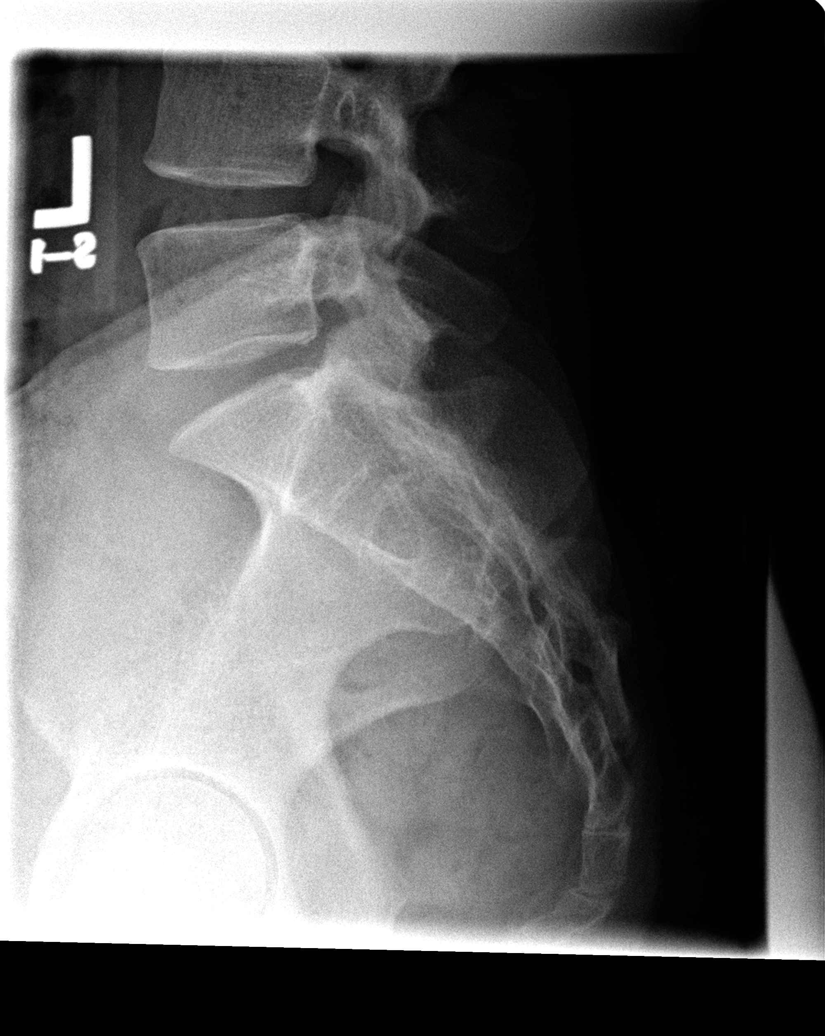

[5 of 5 positions shown; findings below may reference images not displayed]

FINDINGS: Five non-rib bearing lumbar vertebrae.
Osseous mineralization normal.
Vertebral body and disc space heights maintained.
No acute fracture, subluxation or bone destruction.
No spondylolysis.
SI joints symmetric.
IMPRESSION: Normal exam.

## 2019-12-31 ENCOUNTER — Ambulatory Visit (INDEPENDENT_AMBULATORY_CARE_PROVIDER_SITE_OTHER): Payer: Medicaid Other | Admitting: Adult Health

## 2019-12-31 ENCOUNTER — Other Ambulatory Visit: Payer: Self-pay

## 2019-12-31 ENCOUNTER — Encounter: Payer: Self-pay | Admitting: Adult Health

## 2019-12-31 VITALS — BP 142/92 | HR 86 | Ht 66.0 in | Wt 151.0 lb

## 2019-12-31 DIAGNOSIS — Z3201 Encounter for pregnancy test, result positive: Secondary | ICD-10-CM | POA: Diagnosis not present

## 2019-12-31 DIAGNOSIS — Z3A01 Less than 8 weeks gestation of pregnancy: Secondary | ICD-10-CM | POA: Insufficient documentation

## 2019-12-31 DIAGNOSIS — O3680X0 Pregnancy with inconclusive fetal viability, not applicable or unspecified: Secondary | ICD-10-CM | POA: Diagnosis not present

## 2019-12-31 LAB — POCT URINE PREGNANCY: Preg Test, Ur: POSITIVE — AB

## 2019-12-31 NOTE — Patient Instructions (Signed)
First Trimester of Pregnancy The first trimester of pregnancy is from week 1 until the end of week 13 (months 1 through 3). A week after a sperm fertilizes an egg, the egg will implant on the wall of the uterus. This embryo will begin to develop into a baby. Genes from you and your partner will form the baby. The female genes will determine whether the baby will be a boy or a girl. At 6-8 weeks, the eyes and face will be formed, and the heartbeat can be seen on ultrasound. At the end of 12 weeks, all the baby's organs will be formed. Now that you are pregnant, you will want to do everything you can to have a healthy baby. Two of the most important things are to get good prenatal care and to follow your health care provider's instructions. Prenatal care is all the medical care you receive before the baby's birth. This care will help prevent, find, and treat any problems during the pregnancy and childbirth. Body changes during your first trimester Your body goes through many changes during pregnancy. The changes vary from woman to woman.  You may gain or lose a couple of pounds at first.  You may feel sick to your stomach (nauseous) and you may throw up (vomit). If the vomiting is uncontrollable, call your health care provider.  You may tire easily.  You may develop headaches that can be relieved by medicines. All medicines should be approved by your health care provider.  You may urinate more often. Painful urination may mean you have a bladder infection.  You may develop heartburn as a result of your pregnancy.  You may develop constipation because certain hormones are causing the muscles that push stool through your intestines to slow down.  You may develop hemorrhoids or swollen veins (varicose veins).  Your breasts may begin to grow larger and become tender. Your nipples may stick out more, and the tissue that surrounds them (areola) may become darker.  Your gums may bleed and may be  sensitive to brushing and flossing.  Dark spots or blotches (chloasma, mask of pregnancy) may develop on your face. This will likely fade after the baby is born.  Your menstrual periods will stop.  You may have a loss of appetite.  You may develop cravings for certain kinds of food.  You may have changes in your emotions from day to day, such as being excited to be pregnant or being concerned that something may go wrong with the pregnancy and baby.  You may have more vivid and strange dreams.  You may have changes in your hair. These can include thickening of your hair, rapid growth, and changes in texture. Some women also have hair loss during or after pregnancy, or hair that feels dry or thin. Your hair will most likely return to normal after your baby is born. What to expect at prenatal visits During a routine prenatal visit:  You will be weighed to make sure you and the baby are growing normally.  Your blood pressure will be taken.  Your abdomen will be measured to track your baby's growth.  The fetal heartbeat will be listened to between weeks 10 and 14 of your pregnancy.  Test results from any previous visits will be discussed. Your health care provider may ask you:  How you are feeling.  If you are feeling the baby move.  If you have had any abnormal symptoms, such as leaking fluid, bleeding, severe headaches, or abdominal   cramping.  If you are using any tobacco products, including cigarettes, chewing tobacco, and electronic cigarettes.  If you have any questions. Other tests that may be performed during your first trimester include:  Blood tests to find your blood type and to check for the presence of any previous infections. The tests will also be used to check for low iron levels (anemia) and protein on red blood cells (Rh antibodies). Depending on your risk factors, or if you previously had diabetes during pregnancy, you may have tests to check for high blood sugar  that affects pregnant women (gestational diabetes).  Urine tests to check for infections, diabetes, or protein in the urine.  An ultrasound to confirm the proper growth and development of the baby.  Fetal screens for spinal cord problems (spina bifida) and Down syndrome.  HIV (human immunodeficiency virus) testing. Routine prenatal testing includes screening for HIV, unless you choose not to have this test.  You may need other tests to make sure you and the baby are doing well. Follow these instructions at home: Medicines  Follow your health care provider's instructions regarding medicine use. Specific medicines may be either safe or unsafe to take during pregnancy.  Take a prenatal vitamin that contains at least 600 micrograms (mcg) of folic acid.  If you develop constipation, try taking a stool softener if your health care provider approves. Eating and drinking   Eat a balanced diet that includes fresh fruits and vegetables, whole grains, good sources of protein such as meat, eggs, or tofu, and low-fat dairy. Your health care provider will help you determine the amount of weight gain that is right for you.  Avoid raw meat and uncooked cheese. These carry germs that can cause birth defects in the baby.  Eating four or five small meals rather than three large meals a day may help relieve nausea and vomiting. If you start to feel nauseous, eating a few soda crackers can be helpful. Drinking liquids between meals, instead of during meals, also seems to help ease nausea and vomiting.  Limit foods that are high in fat and processed sugars, such as fried and sweet foods.  To prevent constipation: ? Eat foods that are high in fiber, such as fresh fruits and vegetables, whole grains, and beans. ? Drink enough fluid to keep your urine clear or pale yellow. Activity  Exercise only as directed by your health care provider. Most women can continue their usual exercise routine during  pregnancy. Try to exercise for 30 minutes at least 5 days a week. Exercising will help you: ? Control your weight. ? Stay in shape. ? Be prepared for labor and delivery.  Experiencing pain or cramping in the lower abdomen or lower back is a good sign that you should stop exercising. Check with your health care provider before continuing with normal exercises.  Try to avoid standing for long periods of time. Move your legs often if you must stand in one place for a long time.  Avoid heavy lifting.  Wear low-heeled shoes and practice good posture.  You may continue to have sex unless your health care provider tells you not to. Relieving pain and discomfort  Wear a good support bra to relieve breast tenderness.  Take warm sitz baths to soothe any pain or discomfort caused by hemorrhoids. Use hemorrhoid cream if your health care provider approves.  Rest with your legs elevated if you have leg cramps or low back pain.  If you develop varicose veins in   your legs, wear support hose. Elevate your feet for 15 minutes, 3-4 times a day. Limit salt in your diet. Prenatal care  Schedule your prenatal visits by the twelfth week of pregnancy. They are usually scheduled monthly at first, then more often in the last 2 months before delivery.  Write down your questions. Take them to your prenatal visits.  Keep all your prenatal visits as told by your health care provider. This is important. Safety  Wear your seat belt at all times when driving.  Make a list of emergency phone numbers, including numbers for family, friends, the hospital, and police and fire departments. General instructions  Ask your health care provider for a referral to a local prenatal education class. Begin classes no later than the beginning of month 6 of your pregnancy.  Ask for help if you have counseling or nutritional needs during pregnancy. Your health care provider can offer advice or refer you to specialists for help  with various needs.  Do not use hot tubs, steam rooms, or saunas.  Do not douche or use tampons or scented sanitary pads.  Do not cross your legs for long periods of time.  Avoid cat litter boxes and soil used by cats. These carry germs that can cause birth defects in the baby and possibly loss of the fetus by miscarriage or stillbirth.  Avoid all smoking, herbs, alcohol, and medicines not prescribed by your health care provider. Chemicals in these products affect the formation and growth of the baby.  Do not use any products that contain nicotine or tobacco, such as cigarettes and e-cigarettes. If you need help quitting, ask your health care provider. You may receive counseling support and other resources to help you quit.  Schedule a dentist appointment. At home, brush your teeth with a soft toothbrush and be gentle when you floss. Contact a health care provider if:  You have dizziness.  You have mild pelvic cramps, pelvic pressure, or nagging pain in the abdominal area.  You have persistent nausea, vomiting, or diarrhea.  You have a bad smelling vaginal discharge.  You have pain when you urinate.  You notice increased swelling in your face, hands, legs, or ankles.  You are exposed to fifth disease or chickenpox.  You are exposed to German measles (rubella) and have never had it. Get help right away if:  You have a fever.  You are leaking fluid from your vagina.  You have spotting or bleeding from your vagina.  You have severe abdominal cramping or pain.  You have rapid weight gain or loss.  You vomit blood or material that looks like coffee grounds.  You develop a severe headache.  You have shortness of breath.  You have any kind of trauma, such as from a fall or a car accident. Summary  The first trimester of pregnancy is from week 1 until the end of week 13 (months 1 through 3).  Your body goes through many changes during pregnancy. The changes vary from  woman to woman.  You will have routine prenatal visits. During those visits, your health care provider will examine you, discuss any test results you may have, and talk with you about how you are feeling. This information is not intended to replace advice given to you by your health care provider. Make sure you discuss any questions you have with your health care provider. Document Revised: 12/01/2016 Document Reviewed: 12/01/2015 Elsevier Patient Education  2020 [employer]  

## 2019-12-31 NOTE — Progress Notes (Signed)
  Subjective:     Patient ID: Debra Glass, female   DOB: 1985-01-17, 34 y.o.   MRN: [id number]  HPI Emilie is a 33 year old black female,single, in for UPT has missed a period and had 2+HPTs. She has 10 and 34 yo girls. She is working at [employer].  Review of Systems +missed period with 2+HPTs Reviewed past medical,surgical, social and family history. Reviewed medications and allergies.     Objective:   Physical Exam BP (!) 142/92 (BP Location: Left Arm, Patient Position: Sitting, Cuff Size: Normal)   Pulse 86   Ht 5\' 6"  (1.676 m)   Wt 151 lb (68.5 kg)   LMP 11/08/2019   BMI 24.37 kg/m UPT is +, about 7+4 weeks by LMP with EDD 8/13/222.Skin warm and dry. Neck: mid line trachea, normal thyroid, good ROM, no lymphadenopathy noted. Lungs: clear to ausculation bilaterally. Cardiovascular: regular rate and rhythm.Abdomen is soft and non tender. AA is 0  Fall risk is low PHQ 9 scor eis 3, no SI, GAD 7 is 0  Upstream - 12/31/19 1014      Pregnancy Intention Screening   Does the patient want to become pregnant in the next year? N/A    Does the patient's partner want to become pregnant in the next year? N/A    Would the patient like to discuss contraceptive options today? N/A      Contraception Wrap Up   Current Method Pregnant/Seeking Pregnancy    End Method Pregnant/Seeking Pregnancy    Contraception Counseling Provided No              Assessment:     1. Pregnancy examination or test, positive result Continue PNV   2. Less than [redacted] weeks gestation of pregnancy  3. Encounter to determine fetal viability of pregnancy, single or unspecified fetus Return in 1-2 weeks for dating 01/02/20    Plan:     Review handout on first trimester and by [facility]

## 2020-01-14 ENCOUNTER — Telehealth: Payer: Self-pay | Admitting: Adult Health

## 2020-01-14 ENCOUNTER — Other Ambulatory Visit: Payer: Medicaid Other

## 2020-01-14 NOTE — Telephone Encounter (Signed)
Pt tested positive for Covid 01/13/2020 - wonders what can she do at home for her symptoms Her dating U/S was rescheduled  Please advise & notify pt    [address]

## 2020-01-14 NOTE — Telephone Encounter (Signed)
Returned pt's call. Confirmed that pt had MyChart access. Sent pt a msg on MyChart listing medications that are safe to take during pregnancy to alleviate her Covid symptoms. Pt confirms understanding.

## 2020-01-27 ENCOUNTER — Other Ambulatory Visit: Payer: Medicaid Other

## 2020-01-28 ENCOUNTER — Other Ambulatory Visit: Payer: Self-pay

## 2020-01-28 ENCOUNTER — Other Ambulatory Visit: Payer: Medicaid Other

## 2020-01-28 ENCOUNTER — Ambulatory Visit (INDEPENDENT_AMBULATORY_CARE_PROVIDER_SITE_OTHER): Payer: Medicaid Other

## 2020-01-28 ENCOUNTER — Other Ambulatory Visit: Payer: Self-pay | Admitting: Adult Health

## 2020-01-28 DIAGNOSIS — Z1379 Encounter for other screening for genetic and chromosomal anomalies: Secondary | ICD-10-CM

## 2020-01-28 DIAGNOSIS — Z3682 Encounter for antenatal screening for nuchal translucency: Secondary | ICD-10-CM

## 2020-01-28 DIAGNOSIS — O3680X0 Pregnancy with inconclusive fetal viability, not applicable or unspecified: Secondary | ICD-10-CM | POA: Diagnosis not present

## 2020-01-28 DIAGNOSIS — Z3481 Encounter for supervision of other normal pregnancy, first trimester: Secondary | ICD-10-CM

## 2020-01-28 NOTE — Progress Notes (Signed)
[place] 11+4 wks,single IUP,FHR 167 bpm,anterior placenta,normal ovaries,NB present, NT 1.3 mm

## 2020-02-12 ENCOUNTER — Encounter: Payer: Medicaid Other | Admitting: Advanced Practice Midwife

## 2020-02-12 ENCOUNTER — Ambulatory Visit: Payer: Medicaid Other | Admitting: *Deleted

## 2020-02-12 ENCOUNTER — Encounter: Payer: Self-pay | Admitting: Medical

## 2020-02-12 DIAGNOSIS — O0993 Supervision of high risk pregnancy, unspecified, third trimester: Secondary | ICD-10-CM | POA: Insufficient documentation

## 2020-02-12 DIAGNOSIS — Z3490 Encounter for supervision of normal pregnancy, unspecified, unspecified trimester: Secondary | ICD-10-CM | POA: Insufficient documentation

## 2020-02-12 DIAGNOSIS — O09891 Supervision of other high risk pregnancies, first trimester: Secondary | ICD-10-CM | POA: Insufficient documentation

## 2020-02-13 ENCOUNTER — Ambulatory Visit: Payer: Medicaid Other | Admitting: *Deleted

## 2020-02-13 ENCOUNTER — Other Ambulatory Visit (HOSPITAL_COMMUNITY)
Admission: RE | Admit: 2020-02-13 | Discharge: 2020-02-13 | Disposition: A | Discharge status: Home / Self Care | Payer: Medicaid Other | Source: Ambulatory Visit | Attending: Medical | Admitting: Medical

## 2020-02-13 ENCOUNTER — Encounter: Payer: Self-pay | Admitting: Medical

## 2020-02-13 ENCOUNTER — Ambulatory Visit (INDEPENDENT_AMBULATORY_CARE_PROVIDER_SITE_OTHER): Payer: Medicaid Other | Admitting: Medical

## 2020-02-13 ENCOUNTER — Other Ambulatory Visit: Payer: Self-pay

## 2020-02-13 VITALS — BP 117/83 | HR 86 | Wt 158.0 lb

## 2020-02-13 DIAGNOSIS — Z8751 Personal history of pre-term labor: Secondary | ICD-10-CM | POA: Insufficient documentation

## 2020-02-13 DIAGNOSIS — Z3480 Encounter for supervision of other normal pregnancy, unspecified trimester: Secondary | ICD-10-CM | POA: Diagnosis not present

## 2020-02-13 DIAGNOSIS — O09891 Supervision of other high risk pregnancies, first trimester: Secondary | ICD-10-CM

## 2020-02-13 DIAGNOSIS — Z3A13 13 weeks gestation of pregnancy: Secondary | ICD-10-CM

## 2020-02-13 LAB — POCT URINALYSIS DIPSTICK OB
Blood, UA: NEGATIVE
Glucose, UA: NEGATIVE
Ketones, UA: NEGATIVE
Leukocytes, UA: NEGATIVE
Nitrite, UA: NEGATIVE
POC,PROTEIN,UA: NEGATIVE

## 2020-02-13 MED ORDER — BLOOD PRESSURE MONITOR MISC: 0 refills | Status: DC

## 2020-02-13 NOTE — Progress Notes (Signed)
   NURSE VISIT- [facility]  SUBJECTIVE:  Debra Glass is a 35 y.o. [phone] female here for Panorama NIPT and Horizon Carrier Screening . She is [redacted]w[redacted]d pregnant.   OBJECTIVE:  Appears well, in no apparent distress  Blood work drawn from right [facility] without difficulty. 1 attempt(s).   ASSESSMENT: Pregnancy [redacted]w[redacted]d Panorama NIPT and Horizon Carrier Screening  PLAN: [facility] portal information given and instructed patient how to access results   [name]  02/13/2020 10:39 AM

## 2020-02-13 NOTE — Progress Notes (Signed)
   PRENATAL VISIT NOTE  Subjective:  Debra Glass is a 35 y.o. [phone] at [redacted]w[redacted]d being seen today for her first prenatal visit for this pregnancy.  She is currently monitored for the following issues for this high-risk pregnancy and has Encounter for supervision of normal pregnancy, antepartum and History of preterm delivery, currently pregnant in first trimester on their problem list.  Patient reports no complaints.  Contractions: Not present. Vag. Bleeding: None.   . Denies leaking of fluid.   She is planning to breastfeed. Desires Depo Provera for contraception.   The following portions of the patient's history were reviewed and updated as appropriate: allergies, current medications, past family history, past medical history, past social history, past surgical history and problem list.   Objective:   Vitals:   02/13/20 1022  BP: 117/83  Pulse: 86  Weight: 158 lb (71.7 kg)    Fetal Status: Fetal Heart Rate (bpm): 156         General:  Alert, oriented and cooperative. Patient is in no acute distress.  Skin: Skin is warm and dry. No rash noted.   Cardiovascular: Normal heart rate and rhythm noted  Respiratory: Normal respiratory effort, no problems with respiration noted. Clear to auscultation.   Abdomen: Soft, gravid, appropriate for gestational age. Normal bowel sounds. Non-tender. Pain/Pressure: Absent     Pelvic: Cervical exam performed Dilation: Closed Effacement (%): Thick   Normal cervical contour, no lesions, no bleeding following pap, normal discharge  Extremities: Normal range of motion.  Edema: None  Mental Status: Normal mood and affect. Normal behavior. Normal judgment and thought content.   Assessment and Plan:  Pregnancy: [id number] at [redacted]w[redacted]d 1. Supervision of other normal pregnancy, antepartum - Genetic Screening - Blood Pressure Monitor MISC; For regular home bp monitoring during pregnancy  Dispense: 1 each; Refill: 0 - [facility] AMB BABYSCRIPTS SCHEDULE  OPTIMIZATION - Cytology - PAP - Urine Culture - Pain Management Screening Profile (10S) - POC Urinalysis Dipstick OB - CBC/D/Plt+RPR+Rh+ABO+Rub Ab...  2. [redacted] weeks gestation of pregnancy  3. History of preterm delivery, currently pregnant in first trimester - SOL at 35 weeks with first - PROM at 36 weeks with second - Discussed option of 17-P, patient will consider  Preterm labor/first trimester warning symptoms and general obstetric precautions including but not limited to vaginal bleeding, contractions, leaking of fluid and fetal movement were reviewed in detail with the patient. Please refer to After Visit Summary for other counseling recommendations.   Discussed the normal visit cadence for prenatal care Discussed the nature of our practice with multiple providers including residents and students   Future Appointments  Date Time Provider Department Center  03/12/2020  8:50 AM Eure, Amaryllis Dyke, MD [facility]    Vonzella Nipple, PA-C

## 2020-02-13 NOTE — Patient Instructions (Signed)
Preventing Preterm Birth Preterm birth is when a baby is delivered between 20 weeks and 37 weeks of pregnancy. A full-term pregnancy lasts for at least 37 weeks. Preterm birth can be dangerous for your baby because the last few weeks of pregnancy are an important time for your baby to grow and reach a normal birth weight. How can preterm birth affect my baby? Complications of preterm birth may include:  Breathing problems.  Brain damage that affects movement and coordination (cerebral palsy).  Trouble with feeding.  Problems with vision or hearing.  Infections or inflammation of the digestive tract (colitis).  Developmental delays and learning disabilities.  Low birth weight or very low birth weight.  Higher risk for diabetes, heart disease, and high blood pressure later in life. What can increase my risk of having a preterm birth? The exact cause of preterm birth is unknown. The following factors make you more likely to have a preterm birth:  Being diagnosed with placenta previa. This is a condition in which the placenta covers the lowest part of your uterus (cervix), which opens into the vagina.  Certain conditions of your current and past pregnancies, such as: ? Having had a preterm birth before. ? Being pregnant with multiples. ? Waiting less than 6 months between giving birth and becoming pregnant again. ? Certain abnormalities in your unborn baby. ? Vaginal bleeding during pregnancy. ? Becoming pregnant through in vitro fertilization (IVF).  Being overweight or underweight.  Medical history of: ? STIs (sexually transmitted infections) or other infections of the urinary tract and the vagina. ? Long-term (chronic) illnesses, such as blood clotting problems, diabetes, or high blood pressure. ? Short cervix.  Lifestyle and environmental factors, such as: ? Using tobacco products or drugs. ? Drinking alcohol. ? Having stress and no social support. ? Violence in the home  (domestic violence). ? Being exposed to certain chemicals or pollutants in the environment. What actions can I take to prevent preterm birth? Medical care The most important thing you can do to lower your risk for preterm birth is to get routine medical care during pregnancy (prenatal care). Keep all follow-up visits as told by your health care provider. This is important.  If you have a high risk of preterm birth:  You may be referred to a health care provider who specializes in managing high-risk pregnancies (perinatologist).  You may be given medicine to help prevent preterm birth. Lifestyle Certain lifestyle changes can also lower your risk of preterm birth:  Wait at least 6 months after a pregnancy to become pregnant again.  Get to a healthy weight before getting pregnant. If you are overweight, work with your health care provider to safely lose weight.  Do not use any products that contain nicotine or tobacco, such as cigarettes, e-cigarettes, and chewing tobacco. If you need help quitting, ask your health care provider.  Do not drink alcohol.  Do not use drugs.  Eat a healthy diet.  Manage other medical problems, such as diabetes or high blood pressure.   Where to find support For more support, consider:  Talking with your health care provider.  Talking with a therapist or substance abuse counselor, if you need help quitting.  Working with a dietitian or a personal trainer to maintain a healthy weight.  Joining a support group. Where to find more information Learn more about preventing preterm birth from:  Centers for Disease Control and Prevention: cdc.gov  March of Dimes: marchofdimes.org  American Pregnancy Association: americanpregnancy.org Contact a   health care provider if: You have any of the following signs or symptoms of preterm labor before 37 weeks:  A change or increase in vaginal discharge.  Fluid leaking from your vagina.  Pressure or cramps in  your lower abdomen.  A backache that does not go away or gets worse.  Regular tightening (contractions) in your lower abdomen. Get help right away if:  You are having regular painful contractions every 5 minutes or less.  Your water breaks. Summary  Preterm birth means having your baby during weeks 20-37 of pregnancy.  Preterm birth may put your baby at risk for physical and mental problems.  The exact cause of preterm birth is unknown. However, being diagnosed with placenta previa or having vaginal bleeding or an STI (sexually transmitted infection) increases your risk for preterm birth.  Getting good prenatal care can help prevent preterm birth. Keep all follow-up visits as told by your health care provider. This is important.  Contact a health care provider if you have signs or symptoms of preterm labor. This information is not intended to replace advice given to you by your health care provider. Make sure you discuss any questions you have with your health care provider. Document Revised: 11/25/2018 Document Reviewed: 11/25/2018 Elsevier Patient Education  2021 [employer]. http://www.bray.com/.html">  First Trimester of Pregnancy  The first trimester of pregnancy starts on the first day of your last menstrual period until the end of week 12. This is also called months 1 through 3 of pregnancy. Body changes during your first trimester Your body goes through many changes during pregnancy. The changes usually return to normal after your baby is born. Physical changes  You may gain or lose weight.  Your breasts may grow larger and hurt. The area around your nipples may get darker.  Dark spots or blotches may develop on your face.  You may have changes in your hair. Health changes  You may feel like you might vomit (nauseous), and you may vomit.  You may have heartburn.  You may have headaches.  You may have trouble pooping (constipation).  Your  gums may bleed. Other changes  You may get tired easily.  You may pee (urinate) more often.  Your menstrual periods will stop.  You may not feel hungry.  You may want to eat certain kinds of food.  You may have changes in your emotions from day to day.  You may have more dreams. Follow these instructions at home: Medicines  Take over-the-counter and prescription medicines only as told by your doctor. Some medicines are not safe during pregnancy.  Take a prenatal vitamin that contains at least 600 micrograms (mcg) of folic acid. Eating and drinking  Eat healthy meals that include: ? Fresh fruits and vegetables. ? Whole grains. ? Good sources of protein, such as meat, eggs, or tofu. ? Low-fat dairy products.  Avoid raw meat and unpasteurized juice, milk, and cheese.  If you feel like you may vomit, or you vomit: ? Eat 4 or 5 small meals a day instead of 3 large meals. ? Try eating a few soda crackers. ? Drink liquids between meals instead of during meals.  You may need to take these actions to prevent or treat trouble pooping: ? Drink enough fluids to keep your pee (urine) pale yellow. ? Eat foods that are high in fiber. These include beans, whole grains, and fresh fruits and vegetables. ? Limit foods that are high in fat and sugar. These include fried or  sweet foods. Activity  Exercise only as told by your doctor. Most people can do their usual exercise routine during pregnancy.  Stop exercising if you have cramps or pain in your lower belly (abdomen) or low back.  Do not exercise if it is too hot or too humid, or if you are in a place of great height (high altitude).  Avoid heavy lifting.  If you choose to, you may have sex unless your doctor tells you not to. Relieving pain and discomfort  Wear a good support bra if your breasts are sore.  Rest with your legs raised (elevated) if you have leg cramps or low back pain.  If you have bulging veins (varicose  veins) in your legs: ? Wear support hose as told by your doctor. ? Raise your feet for 15 minutes, 3-4 times a day. ? Limit salt in your food. Safety  Wear your seat belt at all times when you are in a car.  Talk with your doctor if someone is hurting you or yelling at you.  Talk with your doctor if you are feeling sad or have thoughts of hurting yourself. Lifestyle  Do not use hot tubs, steam rooms, or saunas.  Do not douche. Do not use tampons or scented sanitary pads.  Do not use herbal medicines, illegal drugs, or medicines that are not approved by your doctor. Do not drink alcohol.  Do not smoke or use any products that contain nicotine or tobacco. If you need help quitting, ask your doctor.  Avoid cat litter boxes and soil that is used by cats. These carry germs that can cause harm to the baby and can cause a loss of your baby by miscarriage or stillbirth. General instructions  Keep all follow-up visits. This is important.  Ask for help if you need counseling or if you need help with nutrition. Your doctor can give you advice or tell you where to go for help.  Visit your dentist. At home, brush your teeth with a soft toothbrush. Floss gently.  Write down your questions. Take them to your prenatal visits. Where to find more information  American Pregnancy Association: americanpregnancy.org  [employer]: www.acog.org  Office on Women's Health: MightyReward.co.nz Contact a doctor if:  You are dizzy.  You have a fever.  You have mild cramps or pressure in your lower belly.  You have a nagging pain in your belly area.  You continue to feel like you may vomit, you vomit, or you have watery poop (diarrhea) for 24 hours or longer.  You have a bad-smelling fluid coming from your vagina.  You have pain when you pee.  You are exposed to a disease that spreads from person to person, such as chickenpox, measles, Zika  virus, HIV, or hepatitis. Get help right away if:  You have spotting or bleeding from your vagina.  You have very bad belly cramping or pain.  You have shortness of breath or chest pain.  You have any kind of injury, such as from a fall or a car crash.  You have new or increased pain, swelling, or redness in an arm or leg. Summary  The first trimester of pregnancy starts on the first day of your last menstrual period until the end of week 12 (months 1 through 3).  Eat 4 or 5 small meals a day instead of 3 large meals.  Do not smoke or use any products that contain nicotine or tobacco. If you need  help quitting, ask your doctor.  Keep all follow-up visits. This information is not intended to replace advice given to you by your health care provider. Make sure you discuss any questions you have with your health care provider. Document Revised: 05/28/2019 Document Reviewed: 04/03/2019 Elsevier Patient Education  2021 [employer].

## 2020-02-14 LAB — CBC/D/PLT+RPR+RH+ABO+RUB AB...
Antibody Screen: NEGATIVE
Basophils Absolute: 0 10*3/uL (ref 0.0–0.2)
Basos: 1 %
EOS (ABSOLUTE): 0.1 10*3/uL (ref 0.0–0.4)
Eos: 1 %
HCV Ab: <0.1 s/co ratio (ref 0.0–0.9)
HIV Screen 4th Generation wRfx: NONREACTIVE
Hematocrit: 42.2 % (ref 34.0–46.6)
Hemoglobin: 13.7 g/dL (ref 11.1–15.9)
Hepatitis B Surface Ag: NEGATIVE
Immature Grans (Abs): 0.1 10*3/uL (ref 0.0–0.1)
Immature Granulocytes: 1 %
Lymphocytes Absolute: 2.1 10*3/uL (ref 0.7–3.1)
Lymphs: 25 %
MCH: 25.9 pg — ABNORMAL LOW (ref 26.6–33.0)
MCHC: 32.5 g/dL (ref 31.5–35.7)
MCV: 80 fL (ref 79–97)
Monocytes Absolute: 0.6 10*3/uL (ref 0.1–0.9)
Monocytes: 7 %
Neutrophils Absolute: 5.4 10*3/uL (ref 1.4–7.0)
Neutrophils: 65 %
Platelets: 276 10*3/uL (ref 150–450)
RBC: 5.29 x10E6/uL — ABNORMAL HIGH (ref 3.77–5.28)
RDW: 13.5 % (ref 11.7–15.4)
RPR Ser Ql: NONREACTIVE
Rh Factor: POSITIVE
Rubella Antibodies, IGG: 2.58 index (ref >0.99)
WBC: 8.3 10*3/uL (ref 3.4–10.8)

## 2020-02-14 LAB — MED LIST OPTION NOT SELECTED

## 2020-02-14 LAB — PMP SCREEN PROFILE (10S), URINE
Phencyclidine Qn, Ur: NEGATIVE ng/mL
Propoxyphene Scrn, Ur: NEGATIVE ng/mL

## 2020-02-14 LAB — HCV INTERPRETATION

## 2020-02-16 LAB — PMP SCREEN PROFILE (10S), URINE
Amphetamine Scrn, Ur: NEGATIVE ng/mL
BARBITURATE SCREEN URINE: NEGATIVE ng/mL
BENZODIAZEPINE SCREEN, URINE: NEGATIVE ng/mL
CANNABINOIDS UR QL SCN: NEGATIVE ng/mL
Cocaine (Metab) Scrn, Ur: NEGATIVE ng/mL
Creatinine(Crt), U: 76.9 mg/dL (ref 20.0–300.0)
Methadone Screen, Urine: NEGATIVE ng/mL
OXYCODONE+OXYMORPHONE UR QL SCN: NEGATIVE ng/mL
Opiate Scrn, Ur: NEGATIVE ng/mL
Ph of Urine: 7.6 (ref 4.5–8.9)

## 2020-02-18 LAB — CYTOLOGY - PAP
Chlamydia: NEGATIVE
Comment: NEGATIVE
Comment: NEGATIVE
Comment: NORMAL
Diagnosis: NEGATIVE
High risk HPV: NEGATIVE
Neisseria Gonorrhea: NEGATIVE

## 2020-02-19 LAB — URINE CULTURE

## 2020-03-09 ENCOUNTER — Encounter: Payer: Self-pay | Admitting: Women's Health

## 2020-03-09 DIAGNOSIS — O285 Abnormal chromosomal and genetic finding on antenatal screening of mother: Secondary | ICD-10-CM | POA: Insufficient documentation

## 2020-03-12 ENCOUNTER — Ambulatory Visit (INDEPENDENT_AMBULATORY_CARE_PROVIDER_SITE_OTHER): Payer: Medicaid Other | Admitting: Obstetrics & Gynecology

## 2020-03-12 ENCOUNTER — Other Ambulatory Visit: Payer: Self-pay

## 2020-03-12 ENCOUNTER — Encounter: Payer: Self-pay | Admitting: Obstetrics & Gynecology

## 2020-03-12 VITALS — BP 129/85 | HR 85 | Wt 161.0 lb

## 2020-03-12 DIAGNOSIS — Z3A17 17 weeks gestation of pregnancy: Secondary | ICD-10-CM

## 2020-03-12 DIAGNOSIS — Z3480 Encounter for supervision of other normal pregnancy, unspecified trimester: Secondary | ICD-10-CM

## 2020-03-12 NOTE — Addendum Note (Signed)
Addended by: Moss Mc on: 03/12/2020 09:47 AM   Modules accepted: Orders

## 2020-03-12 NOTE — Progress Notes (Signed)
   LOW-RISK PREGNANCY VISIT Patient name: Debra Glass MRN [id number]  Date of birth: 07-31-1985 Chief Complaint:   Routine Prenatal Visit  History of Present Illness:   Debra Glass is a 35 y.o. [id number] female at [redacted]w[redacted]d with an Estimated Date of Delivery: 08/14/20 being seen today for ongoing management of a low-risk pregnancy.  Depression screen [facility] 2/9 02/13/2020 12/31/2019  Decreased Interest 0 0  Down, Depressed, Hopeless 0 0  PHQ - 2 Score 0 0  Altered sleeping 0 0  Tired, decreased energy 0 2  Change in appetite 0 1  Feeling bad or failure about yourself  0 0  Trouble concentrating 0 0  Moving slowly or fidgety/restless 0 0  Suicidal thoughts 0 0  PHQ-9 Score 0 3    Today she reports no complaints. Contractions: Not present. Vag. Bleeding: None.  Movement: Present. denies leaking of fluid. Review of Systems:   Pertinent items are noted in HPI Denies abnormal vaginal discharge w/ itching/odor/irritation, headaches, visual changes, shortness of breath, chest pain, abdominal pain, severe nausea/vomiting, or problems with urination or bowel movements unless otherwise stated above. Pertinent History Reviewed:  Reviewed past medical,surgical, social, obstetrical and family history.  Reviewed problem list, medications and allergies. Physical Assessment:   Vitals:   03/12/20 0901  BP: 129/85  Pulse: 85  Weight: 161 lb (73 kg)  Body mass index is 25.99 kg/m.        Physical Examination:   General appearance: Well appearing, and in no distress  Mental status: Alert, oriented to person, place, and time  Skin: Warm & dry  Cardiovascular: Normal heart rate noted  Respiratory: Normal respiratory effort, no distress  Abdomen: Soft, gravid, nontender  Pelvic: Cervical exam deferred         Extremities: Edema: None  Fetal Status:     Movement: Present    Chaperone: n/a    No results found for this or any previous visit (from the past 24 hour(s)).  Assessment &  Plan:  1) Low-risk pregnancy [id number] at [redacted]w[redacted]d with an Estimated Date of Delivery: 08/14/20   2) ,    Meds: No orders of the defined types were placed in this encounter.  Labs/procedures today: AFP  Plan:  Continue routine obstetrical care  Next visit: prefers in person    Reviewed: Preterm labor symptoms and general obstetric precautions including but not limited to vaginal bleeding, contractions, leaking of fluid and fetal movement were reviewed in detail with the patient.  All questions were answered. Has home bp cuff. Rx faxed to . Check bp weekly, let us know if >140/90.   Follow-up: Return in about 3 weeks (around 04/02/2020) for 20 week sono, LROB.  Orders Placed This Encounter  Procedures  . US OB Comp + 14 Wk    Lazaro Arms, MD 03/12/2020 9:46 AM b

## 2020-03-14 LAB — AFP, SERUM, OPEN SPINA BIFIDA
AFP MoM: 1.06
AFP Value: 46.4 ng/mL
Gest. Age on Collection Date: 17.6 weeks
Maternal Age At EDD: 35 yr
OSBR Risk 1 IN: 10000
Test Results:: NEGATIVE
Weight: 161 [lb_av]

## 2020-04-02 ENCOUNTER — Other Ambulatory Visit: Payer: Self-pay | Admitting: Obstetrics & Gynecology

## 2020-04-02 DIAGNOSIS — Z363 Encounter for antenatal screening for malformations: Secondary | ICD-10-CM

## 2020-04-02 DIAGNOSIS — Z3480 Encounter for supervision of other normal pregnancy, unspecified trimester: Secondary | ICD-10-CM

## 2020-04-05 ENCOUNTER — Ambulatory Visit (INDEPENDENT_AMBULATORY_CARE_PROVIDER_SITE_OTHER): Payer: Medicaid Other | Admitting: Obstetrics & Gynecology

## 2020-04-05 ENCOUNTER — Encounter: Payer: Self-pay | Admitting: Obstetrics & Gynecology

## 2020-04-05 ENCOUNTER — Ambulatory Visit (INDEPENDENT_AMBULATORY_CARE_PROVIDER_SITE_OTHER): Payer: Medicaid Other

## 2020-04-05 ENCOUNTER — Other Ambulatory Visit: Payer: Self-pay

## 2020-04-05 VITALS — BP 128/89 | HR 85 | Wt 166.5 lb

## 2020-04-05 DIAGNOSIS — Z363 Encounter for antenatal screening for malformations: Secondary | ICD-10-CM | POA: Diagnosis not present

## 2020-04-05 DIAGNOSIS — Z3480 Encounter for supervision of other normal pregnancy, unspecified trimester: Secondary | ICD-10-CM | POA: Diagnosis not present

## 2020-04-05 DIAGNOSIS — O09891 Supervision of other high risk pregnancies, first trimester: Secondary | ICD-10-CM

## 2020-04-05 MED ORDER — OMEPRAZOLE 20 MG PO CPDR: 20.0000 mg | DELAYED_RELEASE_CAPSULE | Freq: Every day | ORAL | 6 refills | Status: DC

## 2020-04-05 NOTE — Progress Notes (Signed)
   LOW-RISK PREGNANCY VISIT Patient name: Debra Glass MRN [id number]  Date of birth: January 16, 1985 Chief Complaint:   Routine Prenatal Visit and Pregnancy Ultrasound  History of Present Illness:   Debra Glass is a 35 y.o. [id number] female at [redacted]w[redacted]d with an Estimated Date of Delivery: 08/14/20 being seen today for ongoing management of a low-risk pregnancy.  Depression screen [facility] 2/9 02/13/2020 12/31/2019  Decreased Interest 0 0  Down, Depressed, Hopeless 0 0  PHQ - 2 Score 0 0  Altered sleeping 0 0  Tired, decreased energy 0 2  Change in appetite 0 1  Feeling bad or failure about yourself  0 0  Trouble concentrating 0 0  Moving slowly or fidgety/restless 0 0  Suicidal thoughts 0 0  PHQ-9 Score 0 3    Today she reports no complaints. Contractions: Not present. Vag. Bleeding: None.  Movement: Present. denies leaking of fluid. Review of Systems:   Pertinent items are noted in HPI Denies abnormal vaginal discharge w/ itching/odor/irritation, headaches, visual changes, shortness of breath, chest pain, abdominal pain, severe nausea/vomiting, or problems with urination or bowel movements unless otherwise stated above. Pertinent History Reviewed:  Reviewed past medical,surgical, social, obstetrical and family history.  Reviewed problem list, medications and allergies. Physical Assessment:   Vitals:   04/05/20 1159 04/05/20 1201  BP: 140/85 128/89  Pulse: 89 85  Weight: 166 lb 8 oz (75.5 kg)   Body mass index is 26.87 kg/m.        Physical Examination:   General appearance: Well appearing, and in no distress  Mental status: Alert, oriented to person, place, and time  Skin: Warm & dry  Cardiovascular: Normal heart rate noted  Respiratory: Normal respiratory effort, no distress  Abdomen: Soft, gravid, nontender  Pelvic: Cervical exam deferred         Extremities: Edema: Trace  Fetal Status:     Movement: Present    Chaperone: N/A   No results found for this or any  previous visit (from the past 24 hour(s)).  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 omeprazole (PRILOSEC) 20 MG capsule    Sig: Take 1 capsule (20 mg total) by mouth daily. 1 tablet a day    Dispense:  30 capsule    Refill:  6   Labs/procedures today: U/S  Plan:  Continue routine obstetrical care watch BP Next visit: prefers in person    Reviewed: Preterm labor symptoms and general obstetric precautions including but not limited to vaginal bleeding, contractions, leaking of fluid and fetal movement were reviewed in detail with the patient.  All questions were answered. Does have home bp cuff. Office bp cuff given: not applicable. Check bp daily, let us know if consistently >140 and/or >90.  Follow-up: Return in about 4 weeks (around 05/03/2020) for LROB.  No future appointments.  No orders of the defined types were placed in this encounter.  Lazaro Arms  04/05/2020 12:24 PM

## 2020-04-05 NOTE — Progress Notes (Signed)
[place] 21+2 wks,cephalic,cx 4.7 cm,anterior placenta gr 0,normal ovaries,svp of fluid 6.3 cm,fhr 144 bpm,EFW 390 g 28%,anatomy complete,no obvious abnormalities

## 2020-05-04 ENCOUNTER — Encounter: Payer: Self-pay | Admitting: Women's Health

## 2020-05-04 ENCOUNTER — Ambulatory Visit (INDEPENDENT_AMBULATORY_CARE_PROVIDER_SITE_OTHER): Payer: Medicaid Other | Admitting: Women's Health

## 2020-05-04 ENCOUNTER — Other Ambulatory Visit: Payer: Self-pay

## 2020-05-04 VITALS — BP 139/72 | HR 102 | Wt 168.8 lb

## 2020-05-04 DIAGNOSIS — O285 Abnormal chromosomal and genetic finding on antenatal screening of mother: Secondary | ICD-10-CM

## 2020-05-04 DIAGNOSIS — Z3482 Encounter for supervision of other normal pregnancy, second trimester: Secondary | ICD-10-CM

## 2020-05-04 DIAGNOSIS — Z8751 Personal history of pre-term labor: Secondary | ICD-10-CM

## 2020-05-04 DIAGNOSIS — Z3480 Encounter for supervision of other normal pregnancy, unspecified trimester: Secondary | ICD-10-CM

## 2020-05-04 MED ORDER — PROGESTERONE 200 MG PO CAPS: 200.0000 mg | ORAL_CAPSULE | Freq: Every day | ORAL | 5 refills | Status: DC

## 2020-05-04 NOTE — Patient Instructions (Signed)
Debra Glass, I greatly value your feedback.  If you receive a survey following your visit with [place] today, we appreciate you taking the time to fill it out.  Thanks, Joellyn Haff, CNM, WHNP-BC   You will have your sugar test next visit.  Please do not eat or drink anything after midnight the night before you come, not even water.  You will be here for at least two hours.  Please make an appointment online for the bloodwork at [email or web address] for 8:30am (or as close to this as possible). Make sure you select the [facility] service center. The day of the appointment, check in with our office first, then you will go to [facility] to start the sugar test.    [facility] [place], [place] [address]) Entrance C, located off of [address] valet parking  Go to [email or web address] to register for FREE online childbirth classes   Call the office [phone]) or go to [facility] if:  You begin to have strong, frequent contractions  Your water breaks.  Sometimes it is a big gush of fluid, sometimes it is just a trickle that keeps getting your panties wet or running down your legs  You have vaginal bleeding.  It is normal to have a small amount of spotting if your cervix was checked.   You don't feel your baby moving like normal.  If you don't, get you something to eat and drink and lay down and focus on feeling your baby move.   If your baby is still not moving like normal, you should call the office or go to [facility].  [place] Pediatricians/Family Doctors:  [facility] [phone]            [facility] [phone]                 [facility] [phone] (usually not accepting new patients unless you have family there already, you are always welcome to call and ask)       [facility] [phone]       [facility] Pediatricians/Family Doctors:   [facility]: [phone]  [facility]: [phone]  [facility]: [phone]  [facility] Doctors:   [facility]: [phone]   [facility]: [phone]  [facility] Doctors:  [facility]: [phone]   Home Blood Pressure Monitoring for Patients   Your provider has recommended that you check your blood pressure (BP) at least once a week at home. If you do not have a blood pressure cuff at home, one will be provided for you. Contact your provider if you have not received your monitor within 1 week.   Helpful Tips for Accurate Home Blood Pressure Checks  . Don't smoke, exercise, or drink caffeine 30 minutes before checking your BP . Use the restroom before checking your BP (a full bladder can raise your pressure) . Relax in a comfortable upright chair . Feet on the ground . Left arm resting comfortably on a flat surface at the level of your heart . Legs uncrossed . Back supported . Sit quietly and don't talk . Place the cuff on your bare arm . Adjust snuggly, so that only two fingertips can fit between your skin and the top of the cuff . Check 2 readings separated by at least one minute . Keep a log of your BP readings . For a visual, please  reference this diagram: http://ccnc.care/bpdiagram  Provider Name: [facility]     Phone: [phone]  Zone 1: ALL CLEAR  Continue to monitor your symptoms:  . BP reading is less than 140 (top number) or less than 90 (bottom number)  . No right upper stomach pain . No headaches or seeing spots . No feeling nauseated or throwing up . No swelling in face and hands  Zone 2: CAUTION Call your doctor's office for any of the following:  . BP reading is greater than 140 (top number) or greater than 90 (bottom number)  . Stomach pain under your ribs in the middle or right side . Headaches or seeing spots . Feeling nauseated or throwing  up . Swelling in face and hands  Zone 3: EMERGENCY  Seek immediate medical care if you have any of the following:  . BP reading is greater than160 (top number) or greater than 110 (bottom number) . Severe headaches not improving with Tylenol . Serious difficulty catching your breath . Any worsening symptoms from Zone 2   Second Trimester of Pregnancy The second trimester is from week 13 through week 28, months 4 through 6. The second trimester is often a time when you feel your best. Your body has also adjusted to being pregnant, and you begin to feel better physically. Usually, morning sickness has lessened or quit completely, you may have more energy, and you may have an increase in appetite. The second trimester is also a time when the fetus is growing rapidly. At the end of the sixth month, the fetus is about 9 inches long and weighs about 1 pounds. You will likely begin to feel the baby move (quickening) between 18 and 20 weeks of the pregnancy. BODY CHANGES Your body goes through many changes during pregnancy. The changes vary from woman to woman.   Your weight will continue to increase. You will notice your lower abdomen bulging out.  You may begin to get stretch marks on your hips, abdomen, and breasts.  You may develop headaches that can be relieved by medicines approved by your health care provider.  You may urinate more often because the fetus is pressing on your bladder.  You may develop or continue to have heartburn as a result of your pregnancy.  You may develop constipation because certain hormones are causing the muscles that push waste through your intestines to slow down.  You may develop hemorrhoids or swollen, bulging veins (varicose veins).  You may have back pain because of the weight gain and pregnancy hormones relaxing your joints between the bones in your pelvis and as a result of a shift in weight and the muscles that support your balance.  Your breasts will  continue to grow and be tender.  Your gums may bleed and may be sensitive to brushing and flossing.  Dark spots or blotches (chloasma, mask of pregnancy) may develop on your face. This will likely fade after the baby is born.  A dark line from your belly button to the pubic area (linea nigra) may appear. This will likely fade after the baby is born.  You may have changes in your hair. These can include thickening of your hair, rapid growth, and changes in texture. Some women also have hair loss during or after pregnancy, or hair that feels dry or thin. Your hair will most likely return to normal after your baby is born. WHAT TO EXPECT AT YOUR PRENATAL VISITS During a routine prenatal visit:  You  will be weighed to make sure you and the fetus are growing normally.  Your blood pressure will be taken.  Your abdomen will be measured to track your baby's growth.  The fetal heartbeat will be listened to.  Any test results from the previous visit will be discussed. Your health care provider may ask you:  How you are feeling.  If you are feeling the baby move.  If you have had any abnormal symptoms, such as leaking fluid, bleeding, severe headaches, or abdominal cramping.  If you have any questions. Other tests that may be performed during your second trimester include:  Blood tests that check for:  Low iron levels (anemia).  Gestational diabetes (between 24 and 28 weeks).  Rh antibodies.  Urine tests to check for infections, diabetes, or protein in the urine.  An ultrasound to confirm the proper growth and development of the baby.  An amniocentesis to check for possible genetic problems.  Fetal screens for spina bifida and Down syndrome. HOME CARE INSTRUCTIONS   Avoid all smoking, herbs, alcohol, and unprescribed drugs. These chemicals affect the formation and growth of the baby.  Follow your health care provider's instructions regarding medicine use. There are medicines  that are either safe or unsafe to take during pregnancy.  Exercise only as directed by your health care provider. Experiencing uterine cramps is a good sign to stop exercising.  Continue to eat regular, healthy meals.  Wear a good support bra for breast tenderness.  Do not use hot tubs, steam rooms, or saunas.  Wear your seat belt at all times when driving.  Avoid raw meat, uncooked cheese, cat litter boxes, and soil used by cats. These carry germs that can cause birth defects in the baby.  Take your prenatal vitamins.  Try taking a stool softener (if your health care provider approves) if you develop constipation. Eat more high-fiber foods, such as fresh vegetables or fruit and whole grains. Drink plenty of fluids to keep your urine clear or pale yellow.  Take warm sitz baths to soothe any pain or discomfort caused by hemorrhoids. Use hemorrhoid cream if your health care provider approves.  If you develop varicose veins, wear support hose. Elevate your feet for 15 minutes, 3-4 times a day. Limit salt in your diet.  Avoid heavy lifting, wear low heel shoes, and practice good posture.  Rest with your legs elevated if you have leg cramps or low back pain.  Visit your dentist if you have not gone yet during your pregnancy. Use a soft toothbrush to brush your teeth and be gentle when you floss.  A sexual relationship may be continued unless your health care provider directs you otherwise.  Continue to go to all your prenatal visits as directed by your health care provider. SEEK MEDICAL CARE IF:   You have dizziness.  You have mild pelvic cramps, pelvic pressure, or nagging pain in the abdominal area.  You have persistent nausea, vomiting, or diarrhea.  You have a bad smelling vaginal discharge.  You have pain with urination. SEEK IMMEDIATE MEDICAL CARE IF:   You have a fever.  You are leaking fluid from your vagina.  You have spotting or bleeding from your vagina.  You  have severe abdominal cramping or pain.  You have rapid weight gain or loss.  You have shortness of breath with chest pain.  You notice sudden or extreme swelling of your face, hands, ankles, feet, or legs.  You have not felt your baby move  in over an hour.  You have severe headaches that do not go away with medicine.  You have vision changes. Document Released: 12/13/2000 Document Revised: 12/24/2012 Document Reviewed: 02/20/2012 [facility] Patient Information 2015 [place], [place]. This information is not intended to replace advice given to you by your health care provider. Make sure you discuss any questions you have with your health care provider.

## 2020-05-04 NOTE — Progress Notes (Signed)
   LOW-RISK PREGNANCY VISIT Patient name: Debra Glass MRN [id number]  Date of birth: 18-Mar-1985 Chief Complaint:   Routine Prenatal Visit  History of Present Illness:   Debra Glass is a 35 y.o. [id number] female at 109w3d with an Estimated Date of Delivery: 08/14/20 being seen today for ongoing management of a low-risk pregnancy.  Depression screen [facility] 2/9 02/13/2020 12/31/2019  Decreased Interest 0 0  Down, Depressed, Hopeless 0 0  PHQ - 2 Score 0 0  Altered sleeping 0 0  Tired, decreased energy 0 2  Change in appetite 0 1  Feeling bad or failure about yourself  0 0  Trouble concentrating 0 0  Moving slowly or fidgety/restless 0 0  Suicidal thoughts 0 0  PHQ-9 Score 0 3    Today she reports no complaints. Contractions: Not present. Vag. Bleeding: None.  Movement: Present. denies leaking of fluid. Review of Systems:   Pertinent items are noted in HPI Denies abnormal vaginal discharge w/ itching/odor/irritation, headaches, visual changes, shortness of breath, chest pain, abdominal pain, severe nausea/vomiting, or problems with urination or bowel movements unless otherwise stated above. Pertinent History Reviewed:  Reviewed past medical,surgical, social, obstetrical and family history.  Reviewed problem list, medications and allergies. Physical Assessment:   Vitals:   05/04/20 0945  BP: 139/72  Pulse: (!) 102  Weight: 168 lb 12.8 oz (76.6 kg)  Body mass index is 27.25 kg/m.        Physical Examination:   General appearance: Well appearing, and in no distress  Mental status: Alert, oriented to person, place, and time  Skin: Warm & dry  Cardiovascular: Normal heart rate noted  Respiratory: Normal respiratory effort, no distress  Abdomen: Soft, gravid, nontender  Pelvic: Cervical exam deferred         Extremities: Edema: Trace  Fetal Status: Fetal Heart Rate (bpm): 137 Fundal Height: 25 cm Movement: Present    Chaperone: N/A   No results found for this or any  previous visit (from the past 24 hour(s)).  Assessment & Plan:  1) Low-risk pregnancy [id number] at [redacted]w[redacted]d with an Estimated Date of Delivery: 08/14/20   2) H/O PTB x 2, offered Makena at NOB visit, too late to start now, offered prometrium- wants. 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 progesterone (PROMETRIUM) 200 MG capsule    Sig: Place 1 capsule (200 mg total) vaginally at bedtime.    Dispense:  30 capsule    Refill:  5    Order Specific Question:   Supervising Provider    Answer:   Lazaro Arms [[id number]]   Labs/procedures today: none  Plan:  Continue routine obstetrical care  Next visit: prefers will be in person for pn2    Reviewed: Preterm labor symptoms and general obstetric precautions including but not limited to vaginal bleeding, contractions, leaking of fluid and fetal movement were reviewed in detail with the patient.  All questions were answered.   Follow-up: Return in about 3 weeks (around 05/25/2020) for LROB, PN2, in person, CNM.  No future appointments.  No orders of the defined types were placed in this encounter.  Cheral Marker CNM, [facility] 05/04/2020 10:18 AM

## 2020-05-26 ENCOUNTER — Ambulatory Visit (INDEPENDENT_AMBULATORY_CARE_PROVIDER_SITE_OTHER): Payer: Medicaid Other | Admitting: Advanced Practice Midwife

## 2020-05-26 ENCOUNTER — Other Ambulatory Visit: Payer: Self-pay

## 2020-05-26 ENCOUNTER — Other Ambulatory Visit: Payer: Medicaid Other

## 2020-05-26 VITALS — BP 143/88 | HR 89 | Wt 174.0 lb

## 2020-05-26 DIAGNOSIS — Z3480 Encounter for supervision of other normal pregnancy, unspecified trimester: Secondary | ICD-10-CM

## 2020-05-26 DIAGNOSIS — Z131 Encounter for screening for diabetes mellitus: Secondary | ICD-10-CM

## 2020-05-26 DIAGNOSIS — O0993 Supervision of high risk pregnancy, unspecified, third trimester: Secondary | ICD-10-CM

## 2020-05-26 DIAGNOSIS — Z3A28 28 weeks gestation of pregnancy: Secondary | ICD-10-CM

## 2020-05-26 DIAGNOSIS — O133 Gestational [pregnancy-induced] hypertension without significant proteinuria, third trimester: Secondary | ICD-10-CM

## 2020-05-26 DIAGNOSIS — O139 Gestational [pregnancy-induced] hypertension without significant proteinuria, unspecified trimester: Secondary | ICD-10-CM | POA: Insufficient documentation

## 2020-05-26 DIAGNOSIS — Z1389 Encounter for screening for other disorder: Secondary | ICD-10-CM

## 2020-05-26 DIAGNOSIS — O163 Unspecified maternal hypertension, third trimester: Secondary | ICD-10-CM

## 2020-05-26 DIAGNOSIS — Z8759 Personal history of other complications of pregnancy, childbirth and the puerperium: Secondary | ICD-10-CM | POA: Insufficient documentation

## 2020-05-26 LAB — POCT URINALYSIS DIPSTICK OB
Blood, UA: NEGATIVE
Glucose, UA: NEGATIVE
Ketones, UA: NEGATIVE
Leukocytes, UA: NEGATIVE
Nitrite, UA: NEGATIVE
POC,PROTEIN,UA: NEGATIVE

## 2020-05-26 NOTE — Patient Instructions (Signed)
Debra Glass, I greatly value your feedback.  If you receive a survey following your visit with [place] today, we appreciate you taking the time to fill it out.  Thanks, Philipp Deputy, CNM   [facility] ([address] [place], [place] [address]) Entrance C, located off of [address] valet parking  Go to [email or web address] to register for FREE online childbirth classes   Call the office [phone]) or go to [facility] if:  You begin to have strong, frequent contractions  Your water breaks.  Sometimes it is a big gush of fluid, sometimes it is just a trickle that keeps getting your panties wet or running down your legs  You have vaginal bleeding.  It is normal to have a small amount of spotting if your cervix was checked.   You don't feel your baby moving like normal.  If you don't, get you something to eat and drink and lay down and focus on feeling your baby move.  You should feel at least 10 movements in 2 hours.  If you don't, you should call the office or go to [facility].    Tdap Vaccine  It is recommended that you get the Tdap vaccine during the third trimester of EACH pregnancy to help protect your baby from getting pertussis (whooping cough)  27-36 weeks is the BEST time to do this so that you can pass the protection on to your baby. During pregnancy is better than after pregnancy, but if you are unable to get it during pregnancy it will be offered at the hospital.   You can get this vaccine with [place], at the health department, your family doctor, or some local pharmacies  Everyone who will be around your baby should also be up-to-date on their vaccines before the baby comes. Adults (who are not pregnant) only need 1 dose of Tdap during adulthood.   [place] Pediatricians/Family Doctors:  [facility] [phone]            [facility] [phone]                 [facility]  [phone] (usually not accepting new patients unless you have family there already, you are always welcome to call and ask)       [facility] [phone]       [facility] Pediatricians/Family Doctors:   [facility]: [phone]  [facility]: [phone]  [facility]: [phone]  [facility] Doctors:   [facility]: [phone]   [facility]: [phone]  [facility] Doctors:  [facility]: [phone]   Home Blood Pressure Monitoring for Patients   Your provider has recommended that you check your blood pressure (BP) at least once a week at home. If you do not have a blood pressure cuff at home, one will be provided for you. Contact your provider if you have not received your monitor within 1 week.   Helpful Tips for Accurate Home Blood Pressure Checks  . Don't smoke, exercise, or drink caffeine 30 minutes before checking your BP . Use the restroom before checking your BP (a full bladder can raise your pressure) . Relax in a comfortable upright chair . Feet on the ground . Left arm resting comfortably on a flat surface at the level of your heart . Legs uncrossed . Back supported . Sit quietly and don't talk . Place the cuff on your bare  arm . Adjust snuggly, so that only two fingertips can fit between your skin and the top of the cuff . Check 2 readings separated by at least one minute . Keep a log of your BP readings . For a visual, please reference this diagram: http://ccnc.care/bpdiagram  Provider Name: [facility]     Phone: [phone]  Zone 1: ALL CLEAR  Continue to monitor your symptoms:  . BP reading is less than 140 (top number) or less than 90 (bottom number)  . No right upper stomach pain . No headaches or seeing spots . No feeling nauseated or throwing up . No swelling in face and hands  Zone 2: CAUTION Call your  doctor's office for any of the following:  . BP reading is greater than 140 (top number) or greater than 90 (bottom number)  . Stomach pain under your ribs in the middle or right side . Headaches or seeing spots . Feeling nauseated or throwing up . Swelling in face and hands  Zone 3: EMERGENCY  Seek immediate medical care if you have any of the following:  . BP reading is greater than160 (top number) or greater than 110 (bottom number) . Severe headaches not improving with Tylenol . Serious difficulty catching your breath . Any worsening symptoms from Zone 2   Third Trimester of Pregnancy The third trimester is from week 29 through week 42, months 7 through 9. The third trimester is a time when the fetus is growing rapidly. At the end of the ninth month, the fetus is about 20 inches in length and weighs 6-10 pounds.  BODY CHANGES Your body goes through many changes during pregnancy. The changes vary from woman to woman.   Your weight will continue to increase. You can expect to gain 25-35 pounds (11-16 kg) by the end of the pregnancy.  You may begin to get stretch marks on your hips, abdomen, and breasts.  You may urinate more often because the fetus is moving lower into your pelvis and pressing on your bladder.  You may develop or continue to have heartburn as a result of your pregnancy.  You may develop constipation because certain hormones are causing the muscles that push waste through your intestines to slow down.  You may develop hemorrhoids or swollen, bulging veins (varicose veins).  You may have pelvic pain because of the weight gain and pregnancy hormones relaxing your joints between the bones in your pelvis. Backaches may result from overexertion of the muscles supporting your posture.  You may have changes in your hair. These can include thickening of your hair, rapid growth, and changes in texture. Some women also have hair loss during or after pregnancy, or hair that  feels dry or thin. Your hair will most likely return to normal after your baby is born.  Your breasts will continue to grow and be tender. A yellow discharge may leak from your breasts called colostrum.  Your belly button may stick out.  You may feel short of breath because of your expanding uterus.  You may notice the fetus "dropping," or moving lower in your abdomen.  You may have a bloody mucus discharge. This usually occurs a few days to a week before labor begins.  Your cervix becomes thin and soft (effaced) near your due date. WHAT TO EXPECT AT YOUR PRENATAL EXAMS  You will have prenatal exams every 2 weeks until week 36. Then, you will have weekly prenatal exams. During a routine prenatal visit:  You  will be weighed to make sure you and the fetus are growing normally.  Your blood pressure is taken.  Your abdomen will be measured to track your baby's growth.  The fetal heartbeat will be listened to.  Any test results from the previous visit will be discussed.  You may have a cervical check near your due date to see if you have effaced. At around 36 weeks, your caregiver will check your cervix. At the same time, your caregiver will also perform a test on the secretions of the vaginal tissue. This test is to determine if a type of bacteria, Group B streptococcus, is present. Your caregiver will explain this further. Your caregiver may ask you:  What your birth plan is.  How you are feeling.  If you are feeling the baby move.  If you have had any abnormal symptoms, such as leaking fluid, bleeding, severe headaches, or abdominal cramping.  If you have any questions. Other tests or screenings that may be performed during your third trimester include:  Blood tests that check for low iron levels (anemia).  Fetal testing to check the health, activity level, and growth of the fetus. Testing is done if you have certain medical conditions or if there are problems during the  pregnancy. FALSE LABOR You may feel small, irregular contractions that eventually go away. These are called Braxton Hicks contractions, or false labor. Contractions may last for hours, days, or even weeks before true labor sets in. If contractions come at regular intervals, intensify, or become painful, it is best to be seen by your caregiver.  SIGNS OF LABOR   Menstrual-like cramps.  Contractions that are 5 minutes apart or less.  Contractions that start on the top of the uterus and spread down to the lower abdomen and back.  A sense of increased pelvic pressure or back pain.  A watery or bloody mucus discharge that comes from the vagina. If you have any of these signs before the 37th week of pregnancy, call your caregiver right away. You need to go to the hospital to get checked immediately. HOME CARE INSTRUCTIONS   Avoid all smoking, herbs, alcohol, and unprescribed drugs. These chemicals affect the formation and growth of the baby.  Follow your caregiver's instructions regarding medicine use. There are medicines that are either safe or unsafe to take during pregnancy.  Exercise only as directed by your caregiver. Experiencing uterine cramps is a good sign to stop exercising.  Continue to eat regular, healthy meals.  Wear a good support bra for breast tenderness.  Do not use hot tubs, steam rooms, or saunas.  Wear your seat belt at all times when driving.  Avoid raw meat, uncooked cheese, cat litter boxes, and soil used by cats. These carry germs that can cause birth defects in the baby.  Take your prenatal vitamins.  Try taking a stool softener (if your caregiver approves) if you develop constipation. Eat more high-fiber foods, such as fresh vegetables or fruit and whole grains. Drink plenty of fluids to keep your urine clear or pale yellow.  Take warm sitz baths to soothe any pain or discomfort caused by hemorrhoids. Use hemorrhoid cream if your caregiver approves.  If you  develop varicose veins, wear support hose. Elevate your feet for 15 minutes, 3-4 times a day. Limit salt in your diet.  Avoid heavy lifting, wear low heal shoes, and practice good posture.  Rest a lot with your legs elevated if you have leg cramps or low back  pain.  Visit your dentist if you have not gone during your pregnancy. Use a soft toothbrush to brush your teeth and be gentle when you floss.  A sexual relationship may be continued unless your caregiver directs you otherwise.  Do not travel far distances unless it is absolutely necessary and only with the approval of your caregiver.  Take prenatal classes to understand, practice, and ask questions about the labor and delivery.  Make a trial run to the hospital.  Pack your hospital bag.  Prepare the baby's nursery.  Continue to go to all your prenatal visits as directed by your caregiver. SEEK MEDICAL CARE IF:  You are unsure if you are in labor or if your water has broken.  You have dizziness.  You have mild pelvic cramps, pelvic pressure, or nagging pain in your abdominal area.  You have persistent nausea, vomiting, or diarrhea.  You have a bad smelling vaginal discharge.  You have pain with urination. SEEK IMMEDIATE MEDICAL CARE IF:   You have a fever.  You are leaking fluid from your vagina.  You have spotting or bleeding from your vagina.  You have severe abdominal cramping or pain.  You have rapid weight loss or gain.  You have shortness of breath with chest pain.  You notice sudden or extreme swelling of your face, hands, ankles, feet, or legs.  You have not felt your baby move in over an hour.  You have severe headaches that do not go away with medicine.  You have vision changes. Document Released: 12/13/2000 Document Revised: 12/24/2012 Document Reviewed: 02/20/2012 [facility] Patient Information 2015 [place], [place]. This information is not intended to replace advice given to you by your health  care provider. Make sure you discuss any questions you have with your health care provider.

## 2020-05-26 NOTE — Progress Notes (Signed)
HIGH-RISK PREGNANCY VISIT Patient name: Debra Glass MRN [id number]  Date of birth: [birth year] Chief Complaint:   Routine Prenatal Visit and PN2  History of Present Illness:   ANNACLAIRE Glass is a 35 y.o. [id number] female at 66w4dwith an Estimated Date of Delivery: 08/14/20 being seen today for ongoing management of a high-risk pregnancy complicated by gestational hypertension currently on no meds, dx today by 2nd elevation.  Today she reports doing well.  [facility] 05/26/2020 02/13/2020 12/31/2019  Decreased Interest 0 0 0  Down, Depressed, Hopeless 0 0 0  PHQ - 2 Score 0 0 0  Altered sleeping 0 0 0  Tired, decreased energy 0 0 2  Change in appetite 0 0 1  Feeling bad or failure about yourself  0 0 0  Trouble concentrating 0 0 0  Moving slowly or fidgety/restless 0 0 0  Suicidal thoughts 0 0 0  PHQ-9 Score 0 0 3    Contractions: Not present. Vag. Bleeding: None.  Movement: Present. denies leaking of fluid.  Review of Systems:   Pertinent items are noted in HPI Denies abnormal vaginal discharge w/ itching/odor/irritation, headaches, visual changes, shortness of breath, chest pain, abdominal pain, severe nausea/vomiting, or problems with urination or bowel movements unless otherwise stated above. Pertinent History Reviewed:  Reviewed past medical,surgical, social, obstetrical and family history.  Reviewed problem list, medications and allergies. Physical Assessment:   Vitals:   05/26/20 0903 05/26/20 0904  BP: (!) 139/92 (!) 143/88  Pulse: 89   Weight: 174 lb (78.9 kg)   Body mass index is 28.08 kg/m.           Physical Examination:   General appearance: alert, well appearing, and in no distress  Mental status: alert, oriented to person, place, and time  Skin: warm & dry   Extremities: Edema: Trace    Cardiovascular: normal heart rate noted  Respiratory: normal respiratory effort, no distress  Abdomen: gravid, soft, non-tender  Pelvic: Cervical  exam deferred         Fetal Status: Fetal Heart Rate (bpm): 139 Fundal Height: 28 cm Movement: Present    Fetal Surveillance Testing today: doppler    Results for orders placed or performed in visit on 05/26/20 (from the past 24 hour(s))  POC Urinalysis Dipstick OB   Collection Time: 05/26/20  9:09 AM  Result Value Ref Range   Color, UA     Clarity, UA     Glucose, UA Negative Negative   Bilirubin, UA     Ketones, UA neg    Spec Grav, UA     Blood, UA neg    pH, UA     POC,PROTEIN,UA Negative Negative, Trace, Small (1+), Moderate (2+), Large (3+), 4+   Urobilinogen, UA     Nitrite, UA neg    Leukocytes, UA Negative Negative   Appearance     Odor      Assessment & Plan:  High-risk pregnancy: [id number] 276w4dith an Estimated Date of Delivery: 08/14/20   1) gHTN, diagnosed today (21wk 140/85); pre-e labs collected; started on ante testing with growth q 4wks; IOL 37-37.6wks  2) Hx PTD x 2 (35 & 36wks), stable on prometrium  Meds: No orders of the defined types were placed in this encounter.   Labs/procedures today: PN2 and pre-e labs  Treatment Plan:  Start 2x/wk testing with growth q 4wks; IOL 37-37.6wks  Reviewed: Preterm labor symptoms and general obstetric precautions including but not limited to  vaginal bleeding, contractions, leaking of fluid and fetal movement were reviewed in detail with the patient.  All questions were answered. Does have home bp cuff. Office bp cuff given: not applicable. Check bp daily, let us know if consistently >150 and/or >95.  Follow-up: Return in about 2 weeks (around 06/09/2020) for in person, HROB; , [place]: EFW, [place]: BPP; schedule out 2x/wk testing.   Future Appointments  Date Time Provider [place]  06/10/2020 10:45 AM [facility] [place] [facility] None  06/10/2020 11:30 AM Roma Schanz, CNM [facility]    Orders Placed This Encounter  Procedures  . US OB Follow Up  . US Fetal BPP W/O Non Stress  . Protein / creatinine  ratio, urine  . Comp Met (CMET)  . POC Urinalysis Dipstick OB   Myrtis Ser [facility] 05/26/2020 3:08 PM

## 2020-05-27 LAB — COMPREHENSIVE METABOLIC PANEL
ALT: 13 IU/L (ref 0–32)
AST: 16 IU/L (ref 0–40)
Albumin/Globulin Ratio: 1.3 (ref 1.2–2.2)
Albumin: 3.5 g/dL — ABNORMAL LOW (ref 3.8–4.8)
Alkaline Phosphatase: 114 IU/L (ref 44–121)
BUN/Creatinine Ratio: 12 (ref 9–23)
BUN: 8 mg/dL (ref 6–20)
Bilirubin Total: <0.2 mg/dL (ref 0.0–1.2)
CO2: 19 mmol/L — ABNORMAL LOW (ref 20–29)
Calcium: 9.3 mg/dL (ref 8.7–10.2)
Chloride: 105 mmol/L (ref 96–106)
Creatinine, Ser: 0.69 mg/dL (ref 0.57–1.00)
Globulin, Total: 2.6 g/dL (ref 1.5–4.5)
Glucose: 111 mg/dL — ABNORMAL HIGH (ref 65–99)
Potassium: 4.2 mmol/L (ref 3.5–5.2)
Sodium: 138 mmol/L (ref 134–144)
Total Protein: 6.1 g/dL (ref 6.0–8.5)
eGFR: 117 mL/min/{1.73_m2} (ref >59)

## 2020-05-27 LAB — CBC
Hematocrit: 41.4 % (ref 34.0–46.6)
Hemoglobin: 13.6 g/dL (ref 11.1–15.9)
MCH: 26.6 pg (ref 26.6–33.0)
MCHC: 32.9 g/dL (ref 31.5–35.7)
MCV: 81 fL (ref 79–97)
Platelets: 235 10*3/uL (ref 150–450)
RBC: 5.11 x10E6/uL (ref 3.77–5.28)
RDW: 13.5 % (ref 11.7–15.4)
WBC: 8.7 10*3/uL (ref 3.4–10.8)

## 2020-05-27 LAB — PROTEIN / CREATININE RATIO, URINE
Creatinine, Urine: 126.5 mg/dL
Protein, Ur: 22.1 mg/dL
Protein/Creat Ratio: 175 mg/g creat (ref 0–200)

## 2020-05-27 LAB — RPR: RPR Ser Ql: NONREACTIVE

## 2020-05-27 LAB — GLUCOSE TOLERANCE, 2 HOURS W/ 1HR
Glucose, 1 hour: 106 mg/dL (ref 65–179)
Glucose, 2 hour: 83 mg/dL (ref 65–152)
Glucose, Fasting: 73 mg/dL (ref 65–91)

## 2020-05-27 LAB — ANTIBODY SCREEN: Antibody Screen: NEGATIVE

## 2020-05-27 LAB — HIV ANTIBODY (ROUTINE TESTING W REFLEX): HIV Screen 4th Generation wRfx: NONREACTIVE

## 2020-06-09 ENCOUNTER — Other Ambulatory Visit: Payer: Self-pay | Admitting: Advanced Practice Midwife

## 2020-06-09 DIAGNOSIS — O133 Gestational [pregnancy-induced] hypertension without significant proteinuria, third trimester: Secondary | ICD-10-CM

## 2020-06-10 ENCOUNTER — Encounter: Payer: Self-pay | Admitting: Women's Health

## 2020-06-10 ENCOUNTER — Ambulatory Visit (INDEPENDENT_AMBULATORY_CARE_PROVIDER_SITE_OTHER): Payer: Medicaid Other

## 2020-06-10 ENCOUNTER — Other Ambulatory Visit: Payer: Self-pay

## 2020-06-10 ENCOUNTER — Ambulatory Visit (INDEPENDENT_AMBULATORY_CARE_PROVIDER_SITE_OTHER): Payer: Medicaid Other | Admitting: Women's Health

## 2020-06-10 VITALS — BP 145/86 | HR 81 | Wt 172.8 lb

## 2020-06-10 DIAGNOSIS — Z3A30 30 weeks gestation of pregnancy: Secondary | ICD-10-CM

## 2020-06-10 DIAGNOSIS — O365930 Maternal care for other known or suspected poor fetal growth, third trimester, not applicable or unspecified: Secondary | ICD-10-CM

## 2020-06-10 DIAGNOSIS — Z8751 Personal history of pre-term labor: Secondary | ICD-10-CM

## 2020-06-10 DIAGNOSIS — Z23 Encounter for immunization: Secondary | ICD-10-CM

## 2020-06-10 DIAGNOSIS — O0993 Supervision of high risk pregnancy, unspecified, third trimester: Secondary | ICD-10-CM | POA: Diagnosis not present

## 2020-06-10 DIAGNOSIS — O133 Gestational [pregnancy-induced] hypertension without significant proteinuria, third trimester: Secondary | ICD-10-CM | POA: Diagnosis not present

## 2020-06-10 DIAGNOSIS — Z8759 Personal history of other complications of pregnancy, childbirth and the puerperium: Secondary | ICD-10-CM | POA: Insufficient documentation

## 2020-06-10 LAB — POCT URINALYSIS DIPSTICK OB
Blood, UA: NEGATIVE
Glucose, UA: NEGATIVE
Ketones, UA: NEGATIVE
Leukocytes, UA: NEGATIVE
Nitrite, UA: NEGATIVE
POC,PROTEIN,UA: NEGATIVE

## 2020-06-10 MED ORDER — BETAMETHASONE SOD PHOS & ACET 6 (3-3) MG/ML IJ SUSP
12.0000 mg | Freq: Once | INTRAMUSCULAR | Status: AC
  Administered 2020-06-10: 12 mg via INTRAMUSCULAR

## 2020-06-10 NOTE — Patient Instructions (Signed)
Debra Glass, thank you for choosing our office today! We appreciate the opportunity to meet your healthcare needs. You may receive a short survey by mail, e-mail, or through MyChart. If you are happy with your care we would appreciate if you could take just a few minutes to complete the survey questions. We read all of your comments and take your feedback very seriously. Thank you again for choosing our office.  [facility] Team at [facility] at  ([address] [place], [place] [address]) Entrance C, located off of [address] Free 24/7 valet parking   CLASSES: Go to [email or web address] to register for classes (childbirth, breastfeeding, waterbirth, infant CPR, daddy bootcamp, etc.)  Call the office ([phone]) or go to [facility] if: You begin to have strong, frequent contractions Your water breaks.  Sometimes it is a big gush of fluid, sometimes it is just a trickle that keeps getting your panties wet or running down your legs You have vaginal bleeding.  It is normal to have a small amount of spotting if your cervix was checked.  You don't feel your baby moving like normal.  If you don't, get you something to eat and drink and lay down and focus on feeling your baby move.   If your baby is still not moving like normal, you should call the office or go to [facility].  Call the office ([phone]) or go to [facility] for these signs of pre-eclampsia: Severe headache that does not go away with Tylenol Visual changes- seeing spots, double, blurred vision Pain under your right breast or upper abdomen that does not go away with Tums or heartburn medicine Nausea and/or vomiting Severe swelling in your hands, feet, and face   Tdap Vaccine It is recommended that you get the Tdap vaccine during the third trimester of EACH pregnancy to help protect your baby from getting pertussis (whooping cough) 27-36 weeks is the BEST time to  do this so that you can pass the protection on to your baby. During pregnancy is better than after pregnancy, but if you are unable to get it during pregnancy it will be offered at the hospital.  You can get this vaccine with us, at the health department, your family doctor, or some local pharmacies Everyone who will be around your baby should also be up-to-date on their vaccines before the baby comes. Adults (who are not pregnant) only need 1 dose of Tdap during adulthood.   [place] Pediatric[facility]/Family Doctors [facility] ([facility]): [address], [phone]           [facility]: [address], [phone]                 [facility] ([facility]): [address], [phone] (call to ask if accepting patients) [facility]: [address], [place], [phone]    [place] Pediatricians/Family Doctors [facility] ([facility]): [address], [phone] [facility]: [address], [phone] [facility]: [address], [phone]  [place] [facility]  [facility] ([facility]): [phone] [facility]: [address], [phone]   [place] [facility]: [address], [phone]  [place] [facility]  [facility]: [address], [phone]  Home Blood Pressure Monitoring for Patients   Your provider has recommended that you check your   blood pressure (BP) at least once a week at home. If you do not have a blood pressure cuff at home, one will be provided for you. Contact your provider if you have not received your monitor within 1 week.   Helpful Tips for Accurate Home Blood Pressure Checks  Don't smoke, exercise, or drink caffeine 30 minutes before checking your BP Use the restroom before checking your BP (a full bladder can raise your  pressure) Relax in a comfortable upright chair Feet on the ground Left arm resting comfortably on a flat surface at the level of your heart Legs uncrossed Back supported Sit quietly and don't talk Place the cuff on your bare arm Adjust snuggly, so that only two fingertips can fit between your skin and the top of the cuff Check 2 readings separated by at least one minute Keep a log of your BP readings For a visual, please reference this diagram: http://ccnc.care/bpdiagram  Provider Name: [facility]     Phone: [phone]  Zone 1: ALL CLEAR  Continue to monitor your symptoms:  BP reading is less than 140 (top number) or less than 90 (bottom number)  No right upper stomach pain No headaches or seeing spots No feeling nauseated or throwing up No swelling in face and hands  Zone 2: CAUTION Call your doctor's office for any of the following:  BP reading is greater than 140 (top number) or greater than 90 (bottom number)  Stomach pain under your ribs in the middle or right side Headaches or seeing spots Feeling nauseated or throwing up Swelling in face and hands  Zone 3: EMERGENCY  Seek immediate medical care if you have any of the following:  BP reading is greater than160 (top number) or greater than 110 (bottom number) Severe headaches not improving with Tylenol Serious difficulty catching your breath Any worsening symptoms from Zone 2  Preterm Labor and Birth Information  The normal length of a pregnancy is 39-41 weeks. Preterm labor is when labor starts before 37 completed weeks of pregnancy. What are the risk factors for preterm labor? Preterm labor is more likely to occur in women who: Have certain infections during pregnancy such as a bladder infection, sexually transmitted infection, or infection inside the uterus (chorioamnionitis). Have a shorter-than-normal cervix. Have gone into preterm labor before. Have had surgery on their cervix. Are younger than age 17  or older than age 35. Are African American. Are pregnant with twins or multiple babies (multiple gestation). Take street drugs or smoke while pregnant. Do not gain enough weight while pregnant. Became pregnant shortly after having been pregnant. What are the symptoms of preterm labor? Symptoms of preterm labor include: Cramps similar to those that can happen during a menstrual period. The cramps may happen with diarrhea. Pain in the abdomen or lower back. Regular uterine contractions that may feel like tightening of the abdomen. A feeling of increased pressure in the pelvis. Increased watery or bloody mucus discharge from the vagina. Water breaking (ruptured amniotic sac). Why is it important to recognize signs of preterm labor? It is important to recognize signs of preterm labor because babies who are born prematurely may not be fully developed. This can put them at an increased risk for: Long-term (chronic) heart and lung problems. Difficulty immediately after birth with regulating body systems, including blood sugar, body temperature, heart rate, and breathing rate. Bleeding in the brain. Cerebral palsy. Learning difficulties. Death. These risks are highest for babies who are born before 34 weeks   of pregnancy. How is preterm labor treated? Treatment depends on the length of your pregnancy, your condition, and the health of your baby. It may involve: Having a stitch (suture) placed in your cervix to prevent your cervix from opening too early (cerclage). Taking or being given medicines, such as: Hormone medicines. These may be given early in pregnancy to help support the pregnancy. Medicine to stop contractions. Medicines to help mature the baby's lungs. These may be prescribed if the risk of delivery is high. Medicines to prevent your baby from developing cerebral palsy. If the labor happens before 34 weeks of pregnancy, you may need to stay in the hospital. What should I do if I  think I am in preterm labor? If you think that you are going into preterm labor, call your health care provider right away. How can I prevent preterm labor in future pregnancies? To increase your chance of having a full-term pregnancy: Do not use any tobacco products, such as cigarettes, chewing tobacco, and e-cigarettes. If you need help quitting, ask your health care provider. Do not use street drugs or medicines that have not been prescribed to you during your pregnancy. Talk with your health care provider before taking any herbal supplements, even if you have been taking them regularly. Make sure you gain a healthy amount of weight during your pregnancy. Watch for infection. If you think that you might have an infection, get it checked right away. Make sure to tell your health care provider if you have gone into preterm labor before. This information is not intended to replace advice given to you by your health care provider. Make sure you discuss any questions you have with your health care provider. Document Revised: 04/12/2018 Document Reviewed: 05/12/2015 Elsevier Patient Education  2020 Elsevier Inc.   

## 2020-06-10 NOTE — Progress Notes (Signed)
[place] 30+5 wks,cephalic,anterior placenta gr 3,fhr 142 bpm,BPP 8/8,elevated UAD 99.9% with absent EDF,RI 1.0,.89,.98=99.9%,AFI 16.8 cm,normal ovaries,cx 2.9 cm,EFW 1304 g 4.1%,AC 3.6%

## 2020-06-10 NOTE — Progress Notes (Signed)
HIGH-RISK PREGNANCY VISIT Patient name: Debra Glass MRN [id number]  Date of birth: April 14, 1985 Chief Complaint:   Routine Prenatal Visit (U/S)  History of Present Illness:   Debra Glass is a 35 y.o. [id number] female at [redacted]w[redacted]d with an Estimated Date of Delivery: 08/14/20 being seen today for ongoing management of a high-risk pregnancy complicated by elevated dopplers 99.9% (today), fetal growth restriction 4% (dx today), gestational hypertension currently on no meds, and h/o 35 & 36wk PTB.    Today she reports no complaints. Denies ha, visual changes, ruq/epigastric pain, n/v.   Contractions: Not present. Vag. Bleeding: None.  Movement: Present. denies leaking of fluid.   Depression screen [facility] 2/9 05/26/2020 02/13/2020 12/31/2019  Decreased Interest 0 0 0  Down, Depressed, Hopeless 0 0 0  PHQ - 2 Score 0 0 0  Altered sleeping 0 0 0  Tired, decreased energy 0 0 2  Change in appetite 0 0 1  Feeling bad or failure about yourself  0 0 0  Trouble concentrating 0 0 0  Moving slowly or fidgety/restless 0 0 0  Suicidal thoughts 0 0 0  PHQ-9 Score 0 0 3     GAD 7 : Generalized Anxiety Score 05/26/2020 02/13/2020 12/31/2019  Nervous, Anxious, on Edge 0 0 0  Control/stop worrying 0 0 0  Worry too much - different things 0 0 0  Trouble relaxing 0 0 0  Restless 0 0 0  Easily annoyed or irritable 0 0 0  Afraid - awful might happen 0 0 0  Total GAD 7 Score 0 0 0     Review of Systems:   Pertinent items are noted in HPI Denies abnormal vaginal discharge w/ itching/odor/irritation, headaches, visual changes, shortness of breath, chest pain, abdominal pain, severe nausea/vomiting, or problems with urination or bowel movements unless otherwise stated above. Pertinent History Reviewed:  Reviewed past medical,surgical, social, obstetrical and family history.  Reviewed problem list, medications and allergies. Physical Assessment:   Vitals:   06/10/20 1135  BP: (!) 145/86  Pulse: 81   Weight: 172 lb 12.8 oz (78.4 kg)  Body mass index is 27.89 kg/m.           Physical Examination:   General appearance: alert, well appearing, and in no distress  Mental status: alert, oriented to person, place, and time  Skin: warm & dry   Extremities: Edema: None    Cardiovascular: normal heart rate noted  Respiratory: normal respiratory effort, no distress  Abdomen: gravid, soft, non-tender  Pelvic: Cervical exam deferred         Fetal Status: Fetal Heart Rate (bpm): 142 u/s   Movement: Present    Fetal Surveillance Testing today:  [place] 30+5 wks,cephalic,anterior placenta gr 3,fhr 142 bpm,BPP 8/8,elevated UAD 99.9% with absent EDF,RI 1.0,.89,.98=99.9%,AFI 16.8 cm,normal ovaries,cx 2.9 cm,EFW 1304 g 4.1%,AC 3.6%    Chaperone: N/A    Results for orders placed or performed in visit on 06/10/20 (from the past 24 hour(s))  POC Urinalysis Dipstick OB   Collection Time: 06/10/20 11:39 AM  Result Value Ref Range   Color, UA     Clarity, UA     Glucose, UA Negative Negative   Bilirubin, UA     Ketones, UA neg    Spec Grav, UA     Blood, UA neg    pH, UA     POC,PROTEIN,UA Negative Negative, Trace, Small (1+), Moderate (2+), Large (3+), 4+   Urobilinogen, UA     Nitrite, UA  neg    Leukocytes, UA Negative Negative   Appearance     Odor      Assessment & Plan:  High-risk pregnancy: [id number] at [redacted]w[redacted]d with an Estimated Date of Delivery: 08/14/20   1) GHTN, dx @ 28wks, bp stable, asymptomatic, no proteinuria, labs wnl 5/25, will check again today. Reviewed pre-e s/s, reasons to seek care  2) FGR 4% (AC 3.6%) w/ elevated UAD 99.9% w/ persistent AEDF (no reverse), dx today. Discussed w/ Dr. Despina Hidden (out of office), BMZ today/tomorrow, start 2x/wk bpp/dopp/nst and HROB. If any REDF will be admitted. 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  All questions were answered. Does have home bp cuff. Office bp cuff given: not applicable. Check bp twice daily, let us know if consistently >150 and/or >95.  Follow-up: Return for Mon & Thurs bpp/dopp/nst & hrob/md.   Future Appointments  Date Time Provider Department Center  06/11/2020 12:05 PM [facility] NURSE [facility]  06/14/2020 12:15 PM [facility] [place] [facility] None  06/14/2020  1:30 PM Lazaro Arms, MD [facility]  06/17/2020 11:00 AM [facility] [place] [facility] None  06/17/2020 11:50 AM Lazaro Arms, MD [facility]  06/21/2020  9:30 AM [facility]  06/24/2020 10:15 AM [facility] [place] [facility] None  06/24/2020 11:10 AM Myna Hidalgo, DO [facility]  06/28/2020  9:50 AM [facility]  07/01/2020  8:30 AM [facility] [place] [facility] None  07/01/2020  9:30 AM Lazaro Arms, MD [facility]  07/08/2020  8:30 AM [facility] [place] [facility] None  07/08/2020  9:30 AM Jacklyn Shell, CNM [facility]  07/12/2020 10:50 AM [facility]  07/15/2020  8:30 AM [facility] [place] [facility] None  07/15/2020  9:30 AM Lazaro Arms, MD [facility]  07/19/2020  9:30 AM [facility]  07/22/2020  8:30 AM [facility] [place] [facility] None  07/22/2020  9:30 AM Cheral Marker, CNM [facility]  07/26/2020  9:30 AM [facility]  07/29/2020  8:30 AM [facility] [place] [facility] None  07/29/2020  9:30 AM Myna Hidalgo, DO [facility]    Orders Placed This  Encounter  Procedures   Tdap vaccine greater than or equal to 7yo IM   Comprehensive metabolic panel   CBC   Protein / creatinine ratio, urine   POC Urinalysis Dipstick OB   Cheral Marker CNM, [facility] 06/10/2020 2:28 PM

## 2020-06-11 ENCOUNTER — Ambulatory Visit (INDEPENDENT_AMBULATORY_CARE_PROVIDER_SITE_OTHER): Payer: Medicaid Other | Admitting: *Deleted

## 2020-06-11 ENCOUNTER — Other Ambulatory Visit: Payer: Self-pay

## 2020-06-11 VITALS — BP 144/88 | HR 88

## 2020-06-11 DIAGNOSIS — Z8751 Personal history of pre-term labor: Secondary | ICD-10-CM

## 2020-06-11 DIAGNOSIS — O133 Gestational [pregnancy-induced] hypertension without significant proteinuria, third trimester: Secondary | ICD-10-CM

## 2020-06-11 DIAGNOSIS — O365930 Maternal care for other known or suspected poor fetal growth, third trimester, not applicable or unspecified: Secondary | ICD-10-CM

## 2020-06-11 DIAGNOSIS — O0993 Supervision of high risk pregnancy, unspecified, third trimester: Secondary | ICD-10-CM | POA: Diagnosis not present

## 2020-06-11 LAB — PROTEIN / CREATININE RATIO, URINE
Creatinine, Urine: 125.5 mg/dL
Protein, Ur: 12.9 mg/dL
Protein/Creat Ratio: 103 mg/g creat (ref 0–200)

## 2020-06-11 LAB — COMPREHENSIVE METABOLIC PANEL
ALT: 12 IU/L (ref 0–32)
AST: 16 IU/L (ref 0–40)
Albumin/Globulin Ratio: 1.5 (ref 1.2–2.2)
Albumin: 3.7 g/dL — ABNORMAL LOW (ref 3.8–4.8)
Alkaline Phosphatase: 130 IU/L — ABNORMAL HIGH (ref 44–121)
BUN/Creatinine Ratio: 9 (ref 9–23)
BUN: 6 mg/dL (ref 6–20)
Bilirubin Total: <0.2 mg/dL (ref 0.0–1.2)
CO2: 19 mmol/L — ABNORMAL LOW (ref 20–29)
Calcium: 9.3 mg/dL (ref 8.7–10.2)
Chloride: 104 mmol/L (ref 96–106)
Creatinine, Ser: 0.65 mg/dL (ref 0.57–1.00)
Globulin, Total: 2.4 g/dL (ref 1.5–4.5)
Glucose: 67 mg/dL (ref 65–99)
Potassium: 4.4 mmol/L (ref 3.5–5.2)
Sodium: 138 mmol/L (ref 134–144)
Total Protein: 6.1 g/dL (ref 6.0–8.5)
eGFR: 118 mL/min/{1.73_m2} (ref >59)

## 2020-06-11 LAB — CBC
Hematocrit: 43.1 % (ref 34.0–46.6)
Hemoglobin: 14.2 g/dL (ref 11.1–15.9)
MCH: 26.5 pg — ABNORMAL LOW (ref 26.6–33.0)
MCHC: 32.9 g/dL (ref 31.5–35.7)
MCV: 80 fL (ref 79–97)
Platelets: 261 10*3/uL (ref 150–450)
RBC: 5.36 x10E6/uL — ABNORMAL HIGH (ref 3.77–5.28)
RDW: 13.8 % (ref 11.7–15.4)
WBC: 9.4 10*3/uL (ref 3.4–10.8)

## 2020-06-11 MED ORDER — BETAMETHASONE SOD PHOS & ACET 6 (3-3) MG/ML IJ SUSP
12.0000 mg | Freq: Once | INTRAMUSCULAR | Status: AC
  Administered 2020-06-11: 12 mg via INTRAMUSCULAR

## 2020-06-11 NOTE — Progress Notes (Signed)
   NURSE VISIT- INJECTION  SUBJECTIVE:  Debra Glass is a 35 y.o. [phone] female here for a Betamethasone for per provider order. She is [redacted]w[redacted]d pregnant.   OBJECTIVE:  BP (!) 144/88   Pulse 88   LMP 11/08/2019   Appears well, in no apparent distress  Injection administered in: Right upper quad. gluteus  Meds ordered this encounter  Medications   betamethasone acetate-betamethasone sodium phosphate (CELESTONE) injection 12 mg    ASSESSMENT: Pregnancy [redacted]w[redacted]d Betamethasone for per provider order PLAN: Follow-up: as scheduled   [name]  06/11/2020 12:52 PM

## 2020-06-14 ENCOUNTER — Ambulatory Visit (INDEPENDENT_AMBULATORY_CARE_PROVIDER_SITE_OTHER): Payer: Medicaid Other | Admitting: Obstetrics & Gynecology

## 2020-06-14 ENCOUNTER — Other Ambulatory Visit: Payer: Self-pay

## 2020-06-14 ENCOUNTER — Ambulatory Visit (INDEPENDENT_AMBULATORY_CARE_PROVIDER_SITE_OTHER): Payer: Medicaid Other

## 2020-06-14 VITALS — BP 143/88 | HR 100 | Wt 180.0 lb

## 2020-06-14 DIAGNOSIS — O133 Gestational [pregnancy-induced] hypertension without significant proteinuria, third trimester: Secondary | ICD-10-CM

## 2020-06-14 DIAGNOSIS — O365930 Maternal care for other known or suspected poor fetal growth, third trimester, not applicable or unspecified: Secondary | ICD-10-CM

## 2020-06-14 DIAGNOSIS — Z3A31 31 weeks gestation of pregnancy: Secondary | ICD-10-CM

## 2020-06-14 DIAGNOSIS — O09213 Supervision of pregnancy with history of pre-term labor, third trimester: Secondary | ICD-10-CM

## 2020-06-14 DIAGNOSIS — O0993 Supervision of high risk pregnancy, unspecified, third trimester: Secondary | ICD-10-CM

## 2020-06-14 DIAGNOSIS — Z8751 Personal history of pre-term labor: Secondary | ICD-10-CM

## 2020-06-14 DIAGNOSIS — O09891 Supervision of other high risk pregnancies, first trimester: Secondary | ICD-10-CM | POA: Diagnosis not present

## 2020-06-14 LAB — POCT URINALYSIS DIPSTICK OB
Blood, UA: NEGATIVE
Glucose, UA: NEGATIVE
Ketones, UA: NEGATIVE
Leukocytes, UA: NEGATIVE
Nitrite, UA: NEGATIVE
POC,PROTEIN,UA: NEGATIVE

## 2020-06-14 NOTE — Progress Notes (Signed)
[place] 31+2 wks,cephalic,BPP 8/8,FHR 140 BPM,anterior placenta gr 3,afi 15.9 cm,elevated UAD with absent EDF

## 2020-06-14 NOTE — Progress Notes (Signed)
HIGH-RISK PREGNANCY VISIT Patient name: Debra Glass MRN [id number]  Date of birth: 1985/06/09 Chief Complaint:   Routine Prenatal Visit (Nst/bpp/doppler)  History of Present Illness:   Debra Glass is a 35 y.o. [phone] female at [redacted]w[redacted]d with an Estimated Date of Delivery: 08/14/20 being seen today for ongoing management of a high-risk pregnancy complicated by fetal growth restriction 4% and absent end diastolic flow.    Today she reports no complaints. Contractions: Not present. Vag. Bleeding: None.  Movement: Present. denies leaking of fluid.   Depression screen [facility] 2/9 05/26/2020 02/13/2020 12/31/2019  Decreased Interest 0 0 0  Down, Depressed, Hopeless 0 0 0  PHQ - 2 Score 0 0 0  Altered sleeping 0 0 0  Tired, decreased energy 0 0 2  Change in appetite 0 0 1  Feeling bad or failure about yourself  0 0 0  Trouble concentrating 0 0 0  Moving slowly or fidgety/restless 0 0 0  Suicidal thoughts 0 0 0  PHQ-9 Score 0 0 3     GAD 7 : Generalized Anxiety Score 05/26/2020 02/13/2020 12/31/2019  Nervous, Anxious, on Edge 0 0 0  Control/stop worrying 0 0 0  Worry too much - different things 0 0 0  Trouble relaxing 0 0 0  Restless 0 0 0  Easily annoyed or irritable 0 0 0  Afraid - awful might happen 0 0 0  Total GAD 7 Score 0 0 0     Review of Systems:   Pertinent items are noted in HPI Denies abnormal vaginal discharge w/ itching/odor/irritation, headaches, visual changes, shortness of breath, chest pain, abdominal pain, severe nausea/vomiting, or problems with urination or bowel movements unless otherwise stated above. Pertinent History Reviewed:  Reviewed past medical,surgical, social, obstetrical and family history.  Reviewed problem list, medications and allergies. Physical Assessment:   Vitals:   06/14/20 1338  BP: (!) 143/88  Pulse: 100  Weight: 180 lb (81.6 kg)  Body mass index is 29.05 kg/m.           Physical Examination:   General appearance: alert,  well appearing, and in no distress  Mental status: alert, oriented to person, place, and time  Skin: warm & dry   Extremities: Edema: None    Cardiovascular: normal heart rate noted  Respiratory: normal respiratory effort, no distress  Abdomen: gravid, soft, non-tender  Pelvic: Cervical exam deferred         Fetal Status:     Movement: Present    Fetal Surveillance Testing today: BPP 10/10 with AEDF, no reverse flow noted   Chaperone: N/A    Results for orders placed or performed in visit on 06/14/20 (from the past 24 hour(s))  POC Urinalysis Dipstick OB   Collection Time: 06/14/20 12:59 PM  Result Value Ref Range   Color, UA     Clarity, UA     Glucose, UA Negative Negative   Bilirubin, UA     Ketones, UA neg    Spec Grav, UA     Blood, UA neg    pH, UA     POC,PROTEIN,UA Negative Negative, Trace, Small (1+), Moderate (2+), Large (3+), 4+   Urobilinogen, UA     Nitrite, UA neg    Leukocytes, UA Negative Negative   Appearance     Odor      Assessment & Plan:  High-risk pregnancy: [id number] at [redacted]w[redacted]d with an Estimated Date of Delivery: 08/14/20   1) FGR with AEDF, no reverse flow,  stable    Meds: No orders of the defined types were placed in this encounter.   Labs/procedures today: U/S  Treatment Plan:  twice weekly Dopplers, admission if reverse with delivery > 32 weeks, s/p BMZ  Reviewed: Preterm labor symptoms and general obstetric precautions including but not limited to vaginal bleeding, contractions, leaking of fluid and fetal movement were reviewed in detail with the patient.  All questions were answered. Does have home bp cuff. Office bp cuff given: yes. Check bp daily, let us know if consistently >140 and/or >90.  Follow-up: Return for keep scheduled.   Future Appointments  Date Time Provider Department Center  06/17/2020 11:00 AM [facility] [place] [facility] None  06/17/2020 11:50 AM Lazaro Arms, MD [facility]  06/21/2020  9:30 AM [facility] N[facility]  [facility]  06/24/2020 10:15 AM [facility] [place] [facility] None  06/24/2020 11:10 AM Myna Hidalgo, DO [facility]  06/28/2020  9:50 AM [facility]  07/01/2020  8:30 AM [facility] [place] [facility] None  07/01/2020  9:30 AM Lazaro Arms, MD [facility]  07/08/2020  8:30 AM [facility] [place] [facility] None  07/08/2020  9:30 AM Cheral Marker, CNM [facility]  07/12/2020  9:15 AM [facility] [place] [facility] None  07/12/2020 10:50 AM Lazaro Arms, MD [facility]  07/15/2020  8:30 AM [facility] [place] [facility] None  07/15/2020  9:30 AM Lazaro Arms, MD [facility]  07/19/2020  8:30 AM [facility] [place] [facility] None  07/19/2020  9:30 AM Lazaro Arms, MD [facility]  07/22/2020  8:30 AM [facility] [place] [facility] None  07/22/2020  9:30 AM Lazaro Arms, MD [facility]  07/26/2020  8:30 AM [facility] [place] [facility] None  07/26/2020  9:30 AM Myna Hidalgo, DO [facility]  07/29/2020  8:30 AM [facility] [place] [facility] None  07/29/2020  9:30 AM Myna Hidalgo, DO [facility]    Orders Placed This Encounter  Procedures   POC Urinalysis Dipstick OB   Debra Glass 06/14/2020 2:39 PM

## 2020-06-17 ENCOUNTER — Ambulatory Visit (INDEPENDENT_AMBULATORY_CARE_PROVIDER_SITE_OTHER): Payer: Medicaid Other

## 2020-06-17 ENCOUNTER — Inpatient Hospital Stay (HOSPITAL_BASED_OUTPATIENT_CLINIC_OR_DEPARTMENT_OTHER): Payer: Medicaid Other

## 2020-06-17 ENCOUNTER — Other Ambulatory Visit: Payer: Self-pay | Admitting: Women's Health

## 2020-06-17 ENCOUNTER — Other Ambulatory Visit: Payer: Self-pay

## 2020-06-17 ENCOUNTER — Inpatient Hospital Stay (HOSPITAL_COMMUNITY)
Admission: EM | Admit: 2020-06-17 | Discharge: 2020-06-20 | DRG: 786 | Disposition: A | Discharge status: Home / Self Care | Payer: Medicaid Other | Attending: Obstetrics and Gynecology | Admitting: Obstetrics and Gynecology

## 2020-06-17 ENCOUNTER — Ambulatory Visit (INDEPENDENT_AMBULATORY_CARE_PROVIDER_SITE_OTHER): Payer: Medicaid Other | Admitting: Obstetrics & Gynecology

## 2020-06-17 ENCOUNTER — Encounter: Payer: Self-pay | Admitting: Obstetrics & Gynecology

## 2020-06-17 ENCOUNTER — Encounter: Payer: Medicaid Other | Admitting: Obstetrics & Gynecology

## 2020-06-17 VITALS — BP 137/96 | HR 66 | Wt 178.0 lb

## 2020-06-17 DIAGNOSIS — O4693 Antepartum hemorrhage, unspecified, third trimester: Secondary | ICD-10-CM

## 2020-06-17 DIAGNOSIS — Z3A31 31 weeks gestation of pregnancy: Secondary | ICD-10-CM | POA: Diagnosis not present

## 2020-06-17 DIAGNOSIS — Z87891 Personal history of nicotine dependence: Secondary | ICD-10-CM

## 2020-06-17 DIAGNOSIS — O09523 Supervision of elderly multigravida, third trimester: Secondary | ICD-10-CM | POA: Diagnosis not present

## 2020-06-17 DIAGNOSIS — O09891 Supervision of other high risk pregnancies, first trimester: Secondary | ICD-10-CM

## 2020-06-17 DIAGNOSIS — Z20822 Contact with and (suspected) exposure to covid-19: Secondary | ICD-10-CM | POA: Diagnosis present

## 2020-06-17 DIAGNOSIS — O364XX0 Maternal care for intrauterine death, not applicable or unspecified: Secondary | ICD-10-CM

## 2020-06-17 DIAGNOSIS — O133 Gestational [pregnancy-induced] hypertension without significant proteinuria, third trimester: Secondary | ICD-10-CM

## 2020-06-17 DIAGNOSIS — O365930 Maternal care for other known or suspected poor fetal growth, third trimester, not applicable or unspecified: Secondary | ICD-10-CM

## 2020-06-17 DIAGNOSIS — O09213 Supervision of pregnancy with history of pre-term labor, third trimester: Secondary | ICD-10-CM

## 2020-06-17 DIAGNOSIS — Z8759 Personal history of other complications of pregnancy, childbirth and the puerperium: Secondary | ICD-10-CM | POA: Diagnosis present

## 2020-06-17 DIAGNOSIS — O134 Gestational [pregnancy-induced] hypertension without significant proteinuria, complicating childbirth: Secondary | ICD-10-CM | POA: Diagnosis present

## 2020-06-17 DIAGNOSIS — O45023 Premature separation of placenta with disseminated intravascular coagulation, third trimester: Secondary | ICD-10-CM | POA: Diagnosis present

## 2020-06-17 DIAGNOSIS — O4593 Premature separation of placenta, unspecified, third trimester: Secondary | ICD-10-CM

## 2020-06-17 DIAGNOSIS — O0993 Supervision of high risk pregnancy, unspecified, third trimester: Secondary | ICD-10-CM

## 2020-06-17 DIAGNOSIS — Z8751 Personal history of pre-term labor: Secondary | ICD-10-CM

## 2020-06-17 DIAGNOSIS — O132 Gestational [pregnancy-induced] hypertension without significant proteinuria, second trimester: Secondary | ICD-10-CM

## 2020-06-17 LAB — POCT URINALYSIS DIPSTICK OB
Blood, UA: NEGATIVE
Glucose, UA: NEGATIVE
Ketones, UA: NEGATIVE
Leukocytes, UA: NEGATIVE
Nitrite, UA: NEGATIVE
POC,PROTEIN,UA: NEGATIVE

## 2020-06-17 LAB — COMPREHENSIVE METABOLIC PANEL
ALT: 14 U/L (ref 0–44)
ALT: 9 U/L (ref 0–44)
AST: 16 U/L (ref 15–41)
AST: 17 U/L (ref 15–41)
Albumin: 3.3 g/dL — ABNORMAL LOW (ref 3.5–5.0)
Albumin: 2.4 g/dL — ABNORMAL LOW (ref 3.5–5.0)
Alkaline Phosphatase: 128 U/L — ABNORMAL HIGH (ref 38–126)
Alkaline Phosphatase: 100 U/L (ref 38–126)
Anion gap: 7 (ref 5–15)
Anion gap: 7 (ref 5–15)
BUN: 9 mg/dL (ref 6–20)
BUN: 10 mg/dL (ref 6–20)
CO2: 25 mmol/L (ref 22–32)
CO2: 21 mmol/L — ABNORMAL LOW (ref 22–32)
Calcium: 9 mg/dL (ref 8.9–10.3)
Calcium: 8.2 mg/dL — ABNORMAL LOW (ref 8.9–10.3)
Chloride: 105 mmol/L (ref 98–111)
Chloride: 110 mmol/L (ref 98–111)
Creatinine, Ser: 0.68 mg/dL (ref 0.44–1.00)
Creatinine, Ser: 0.81 mg/dL (ref 0.44–1.00)
GFR, Estimated: >60 mL/min (ref >60)
GFR, Estimated: >60 mL/min (ref >60)
Glucose, Bld: 54 mg/dL — ABNORMAL LOW (ref 70–99)
Glucose, Bld: 109 mg/dL — ABNORMAL HIGH (ref 70–99)
Potassium: 3.5 mmol/L (ref 3.5–5.1)
Potassium: 4 mmol/L (ref 3.5–5.1)
Sodium: 137 mmol/L (ref 135–145)
Sodium: 138 mmol/L (ref 135–145)
Total Bilirubin: 0.4 mg/dL (ref 0.3–1.2)
Total Bilirubin: 0.5 mg/dL (ref 0.3–1.2)
Total Protein: 7 g/dL (ref 6.5–8.1)
Total Protein: 4.7 g/dL — ABNORMAL LOW (ref 6.5–8.1)

## 2020-06-17 LAB — CBC WITH DIFFERENTIAL/PLATELET
Abs Immature Granulocytes: 0.1 10*3/uL — ABNORMAL HIGH (ref 0.00–0.07)
Basophils Absolute: 0.1 10*3/uL (ref 0.0–0.1)
Basophils Relative: 1 %
Eosinophils Absolute: 0.1 10*3/uL (ref 0.0–0.5)
Eosinophils Relative: 1 %
HCT: 44.4 % (ref 36.0–46.0)
Hemoglobin: 14.5 g/dL (ref 12.0–15.0)
Immature Granulocytes: 1 %
Lymphocytes Relative: 26 %
Lymphs Abs: 2.8 10*3/uL (ref 0.7–4.0)
MCH: 26.9 pg (ref 26.0–34.0)
MCHC: 32.7 g/dL (ref 30.0–36.0)
MCV: 82.2 fL (ref 80.0–100.0)
Monocytes Absolute: 0.8 10*3/uL (ref 0.1–1.0)
Monocytes Relative: 7 %
Neutro Abs: 6.9 10*3/uL (ref 1.7–7.7)
Neutrophils Relative %: 64 %
Platelets: 229 10*3/uL (ref 150–400)
RBC: 5.4 MIL/uL — ABNORMAL HIGH (ref 3.87–5.11)
RDW: 15.6 % — ABNORMAL HIGH (ref 11.5–15.5)
WBC: 10.7 10*3/uL — ABNORMAL HIGH (ref 4.0–10.5)
nRBC: 0 % (ref 0.0–0.2)

## 2020-06-17 LAB — TYPE AND SCREEN
ABO/RH(D): O POS
Antibody Screen: NEGATIVE

## 2020-06-17 LAB — CBC
HCT: 35.8 % — ABNORMAL LOW (ref 36.0–46.0)
Hemoglobin: 11.5 g/dL — ABNORMAL LOW (ref 12.0–15.0)
MCH: 26.6 pg (ref 26.0–34.0)
MCHC: 32.1 g/dL (ref 30.0–36.0)
MCV: 82.9 fL (ref 80.0–100.0)
Platelets: 136 10*3/uL — ABNORMAL LOW (ref 150–400)
RBC: 4.32 MIL/uL (ref 3.87–5.11)
RDW: 15.1 % (ref 11.5–15.5)
WBC: 21.8 10*3/uL — ABNORMAL HIGH (ref 4.0–10.5)
nRBC: 0 % (ref 0.0–0.2)

## 2020-06-17 LAB — RESP PANEL BY RT-PCR (FLU A&B, COVID) ARPGX2
Influenza A by PCR: NEGATIVE
Influenza B by PCR: NEGATIVE
SARS Coronavirus 2 by RT PCR: NEGATIVE

## 2020-06-17 LAB — DIC (DISSEMINATED INTRAVASCULAR COAGULATION)PANEL
D-Dimer, Quant: >20 ug/mL-FEU — ABNORMAL HIGH (ref 0.00–0.50)
Fibrinogen: 150 mg/dL — ABNORMAL LOW (ref 210–475)
INR: 1.3 — ABNORMAL HIGH (ref 0.8–1.2)
Platelets: 126 10*3/uL — ABNORMAL LOW (ref 150–400)
Prothrombin Time: 15.9 seconds — ABNORMAL HIGH (ref 11.4–15.2)
Smear Review: NONE SEEN
aPTT: 32 seconds (ref 24–36)

## 2020-06-17 MED ORDER — HYDROXYZINE HCL 50 MG PO TABS: 50.0000 mg | ORAL_TABLET | Freq: Four times a day (QID) | ORAL | Status: DC | PRN

## 2020-06-17 MED ORDER — SOD CITRATE-CITRIC ACID 500-334 MG/5ML PO SOLN
30.0000 mL | ORAL | Status: DC | PRN
  Administered 2020-06-18: 30 mL via ORAL

## 2020-06-17 MED ORDER — MISOPROSTOL 200 MCG PO TABS
200.0000 ug | ORAL_TABLET | Freq: Once | ORAL | Status: AC
  Administered 2020-06-17: 200 ug via ORAL
  Filled 2020-06-17: qty 1

## 2020-06-17 MED ORDER — OXYCODONE-ACETAMINOPHEN 5-325 MG PO TABS: 1.0000 | ORAL_TABLET | ORAL | Status: DC | PRN

## 2020-06-17 MED ORDER — ACETAMINOPHEN 325 MG PO TABS: 650.0000 mg | ORAL_TABLET | ORAL | Status: DC | PRN

## 2020-06-17 MED ORDER — FENTANYL CITRATE (PF) 100 MCG/2ML IJ SOLN
INTRAMUSCULAR | Status: AC
  Administered 2020-06-17: 100 ug via INTRAVENOUS
  Filled 2020-06-17: qty 2

## 2020-06-17 MED ORDER — LACTATED RINGERS IV SOLN: INTRAVENOUS | Status: DC

## 2020-06-17 MED ORDER — FENTANYL CITRATE (PF) 100 MCG/2ML IJ SOLN
50.0000 ug | INTRAMUSCULAR | Status: DC | PRN
  Administered 2020-06-17: 100 ug via INTRAVENOUS
  Filled 2020-06-17: qty 2

## 2020-06-17 MED ORDER — ONDANSETRON HCL 4 MG/2ML IJ SOLN: 4.0000 mg | Freq: Four times a day (QID) | INTRAMUSCULAR | Status: DC | PRN

## 2020-06-17 MED ORDER — OXYCODONE-ACETAMINOPHEN 5-325 MG PO TABS
2.0000 | ORAL_TABLET | ORAL | Status: DC | PRN
Start: 2020-06-17 — End: 2020-06-18

## 2020-06-17 MED ORDER — OXYTOCIN BOLUS FROM INFUSION: 333.0000 mL | Freq: Once | INTRAVENOUS | Status: DC

## 2020-06-17 MED ORDER — OXYTOCIN-SODIUM CHLORIDE 30-0.9 UT/500ML-% IV SOLN: 2.5000 [IU]/h | INTRAVENOUS | Status: DC

## 2020-06-17 MED ORDER — SODIUM CHLORIDE 0.9 % IV SOLN: Freq: Once | INTRAVENOUS | Status: AC

## 2020-06-17 MED ORDER — LABETALOL HCL 200 MG PO TABS: 200.0000 mg | ORAL_TABLET | Freq: Two times a day (BID) | ORAL | 3 refills | Status: DC

## 2020-06-17 MED ORDER — LIDOCAINE HCL (PF) 1 % IJ SOLN: 30.0000 mL | INTRAMUSCULAR | Status: DC | PRN

## 2020-06-17 MED ORDER — LACTATED RINGERS IV SOLN: 500.0000 mL | INTRAVENOUS | Status: DC | PRN

## 2020-06-17 NOTE — ED Notes (Signed)
[facility] at bedside to transport patient to [facility].

## 2020-06-17 NOTE — Progress Notes (Signed)
HIGH-RISK PREGNANCY VISIT Patient name: Debra Glass MRN [id number]  Date of birth: 20-Aug-1985 Chief Complaint:   Routine Prenatal Visit  History of Present Illness:   Debra Glass is a 35 y.o. [id number] female at [redacted]w[redacted]d with an Estimated Date of Delivery: 08/14/20 being seen today for ongoing management of a high-risk pregnancy complicated by FGR with AEDF, GHTN.    Today she reports no complaints. Contractions: Not present. Vag. Bleeding: None.  Movement: Present. denies leaking of fluid.   Depression screen [facility] 2/9 05/26/2020 02/13/2020 12/31/2019  Decreased Interest 0 0 0  Down, Depressed, Hopeless 0 0 0  PHQ - 2 Score 0 0 0  Altered sleeping 0 0 0  Tired, decreased energy 0 0 2  Change in appetite 0 0 1  Feeling bad or failure about yourself  0 0 0  Trouble concentrating 0 0 0  Moving slowly or fidgety/restless 0 0 0  Suicidal thoughts 0 0 0  PHQ-9 Score 0 0 3     GAD 7 : Generalized Anxiety Score 05/26/2020 02/13/2020 12/31/2019  Nervous, Anxious, on Edge 0 0 0  Control/stop worrying 0 0 0  Worry too much - different things 0 0 0  Trouble relaxing 0 0 0  Restless 0 0 0  Easily annoyed or irritable 0 0 0  Afraid - awful might happen 0 0 0  Total GAD 7 Score 0 0 0     Review of Systems:   Pertinent items are noted in HPI Denies abnormal vaginal discharge w/ itching/odor/irritation, headaches, visual changes, shortness of breath, chest pain, abdominal pain, severe nausea/vomiting, or problems with urination or bowel movements unless otherwise stated above. Pertinent History Reviewed:  Reviewed past medical,surgical, social, obstetrical and family history.  Reviewed problem list, medications and allergies. Physical Assessment:   Vitals:   06/17/20 1144 06/17/20 1205  BP: (!) 149/94 (!) 137/96  Pulse: 80 66  Weight: 178 lb (80.7 kg)   Body mass index is 28.73 kg/m.           Physical Examination:   General appearance: alert, well appearing, and in no  distress  Mental status: alert, oriented to person, place, and time  Skin: warm & dry   Extremities: Edema: None    Cardiovascular: normal heart rate noted  Respiratory: normal respiratory effort, no distress  Abdomen: gravid, soft, non-tender  Pelvic: Cervical exam deferred         Fetal Status:     Movement: Present    Fetal Surveillance Testing today: BPP 8/8 with NR NST, no decels   Chaperone: Faith Rogue    Results for orders placed or performed in visit on 06/17/20 (from the past 24 hour(s))  POC Urinalysis Dipstick OB   Collection Time: 06/17/20 11:57 AM  Result Value Ref Range   Color, UA     Clarity, UA     Glucose, UA Negative Negative   Bilirubin, UA     Ketones, UA neg    Spec Grav, UA     Blood, UA neg    pH, UA     POC,PROTEIN,UA Negative Negative, Trace, Small (1+), Moderate (2+), Large (3+), 4+   Urobilinogen, UA     Nitrite, UA neg    Leukocytes, UA Negative Negative   Appearance     Odor      Assessment & Plan:  High-risk pregnancy: [id number] at [redacted]w[redacted]d with an Estimated Date of Delivery: 08/14/20   1) FGR with AEDF no reverse flow,  stable, reassuring surveillance  2) GHTN, begin labetalol 200 mg BID, stable  Meds: No orders of the defined types were placed in this encounter.   Labs/procedures today: NST and U/S  Treatment Plan:  continue twice weekly surveillance IOL if reverse flow  Reviewed: Preterm labor symptoms and general obstetric precautions including but not limited to vaginal bleeding, contractions, leaking of fluid and fetal movement were reviewed in detail with the patient.  All questions were answered. Does have home bp cuff. Office bp cuff given: not applicable. Check bp daily, let us know if consistently >150 and/or >95.  Follow-up: No follow-ups on file.   Future Appointments  Date Time Provider Department Center  06/21/2020  3:30 PM [facility] - [facility] [place] [facility] None  06/21/2020  4:20 PM Myna Hidalgo, DO [facility]  06/24/2020  10:15 AM [facility] [place] [facility] None  06/24/2020 11:10 AM Myna Hidalgo, DO [facility]  06/28/2020  9:50 AM [facility] NURSE [facility]  07/01/2020  8:30 AM [facility] [place] [facility] None  07/01/2020  9:30 AM Lazaro Arms, MD [facility]  07/08/2020  8:30 AM [facility] [place] [facility] None  07/08/2020  9:30 AM Cheral Marker, CNM [facility]  07/12/2020  9:15 AM [facility] [place] [facility] None  07/12/2020 10:50 AM Lazaro Arms, MD [facility]  07/15/2020  8:30 AM [facility] [place] [facility] None  07/15/2020  9:30 AM Lazaro Arms, MD [facility]  07/19/2020  8:30 AM [facility] [place] [facility] None  07/19/2020  9:30 AM Lazaro Arms, MD [facility]  07/22/2020  8:30 AM [facility] [place] [facility] None  07/22/2020  9:30 AM Lazaro Arms, MD [facility]  07/26/2020  8:30 AM [facility] [place] [facility] None  07/26/2020  9:30 AM Myna Hidalgo, DO [facility]  07/29/2020  8:30 AM [facility] [place] [facility] None  07/29/2020  9:30 AM Myna Hidalgo, DO [facility]    Orders Placed This Encounter  Procedures   POC Urinalysis Dipstick OB   Amaryllis Dyke Leah Thornberry  06/17/2020 12:37 PM

## 2020-06-17 NOTE — ED Triage Notes (Signed)
31 weeks, HTN problems throughout preg, followed by GYN, 3 preg, premature in 1st two daughters, states she cannot tell how far apart contractions are stating they are pretty constant, bleeding began ~5-10 mins ago

## 2020-06-17 NOTE — Progress Notes (Signed)
Radiology at bedside for u/s

## 2020-06-17 NOTE — Progress Notes (Signed)
Dr. Eckstat at bedside.  

## 2020-06-17 NOTE — ED Notes (Signed)
Dr. Charm Barges at bedside to perform [place]; per MD fetus has no cardiac activity and abruption pattern on the monitor. [facility] updated on patient status.

## 2020-06-17 NOTE — ED Triage Notes (Signed)
Per Dr. Charm Barges, patient's fetus is not engaged in the cervix at this point in time. Cervix remains up high; patient continues to have obvious vaginal bleeding.

## 2020-06-17 NOTE — Progress Notes (Signed)
[place] 31+5 wks,cephalic,BPP 8/8,elevated UAD with absent EDF,RI .94,1.00,1.00..85=99.9%,anterior placenta gr 3,AFI 11.4 cm,fhr 130 bpm

## 2020-06-17 NOTE — ED Provider Notes (Signed)
[facility] EMERGENCY DEPARTMENT Provider Note   CSN: [id number] Arrival date & time: 06/17/20  1833     History Chief Complaint  Patient presents with   Hypertension   Vaginal Bleeding    Debra Glass is a 35 y.o. female.  She is [redacted] weeks pregnant G3P question.  She has been having some elevated blood pressures during the pregnancy.  She was seen by OB today and was doing well.  About 20 minutes prior to arrival she experienced some low abdominal cramping pain and had clots of blood from the vagina.  No nausea or vomiting.  The history is provided by the patient and the spouse.  Vaginal Bleeding Quality:  Bright red and clots Severity:  Moderate Onset quality:  Sudden Progression:  Resolved Chronicity:  New Possible pregnancy: yes   Relieved by:  None tried Worsened by:  Nothing Ineffective treatments:  None tried Associated symptoms: abdominal pain   Associated symptoms: no back pain, no dysuria, no fever and no nausea       Past Medical History:  Diagnosis Date   Medical history non-contributory     Patient Active Problem List   Diagnosis Date Noted   IUGR (intrauterine growth restriction) affecting care of mother 06/10/2020   Gestational hypertension 05/26/2020   Abnormal chromosomal and genetic finding on antenatal screening mother 03/09/2020   History of preterm delivery 02/13/2020   Supervision of other high risk pregnancies, first trimester 02/12/2020    Past Surgical History:  Procedure Laterality Date   DILATION AND CURETTAGE OF UTERUS       OB History     Gravida  4   Para  2   Term      Preterm  2   AB  1   Living  2      SAB  1   IAB      Ectopic      Multiple      Live Births  2           Family History  Problem Relation Age of Onset   Heart disease Maternal Grandmother        had a pacemaker   Cancer Maternal Grandfather     Social History   Tobacco Use   Smoking status: Former    Pack years: 0.00     Types: Cigarettes   Smokeless tobacco: Never  Vaping Use   Vaping Use: Never used  Substance Use Topics   Alcohol use: Not Currently    Comment: occasionally   Drug use: No    Home Medications Prior to Admission medications   Medication Sig Start Date End Date Taking? Authorizing Provider  Blood Pressure Monitor MISC For regular home bp monitoring during pregnancy 02/13/20   Marny Lowenstein, PA-C  labetalol (NORMODYNE) 200 MG tablet Take 1 tablet (200 mg total) by mouth 2 (two) times daily. 06/17/20   Lazaro Arms, MD  omeprazole (PRILOSEC) 20 MG capsule Take 1 capsule (20 mg total) by mouth daily. 1 tablet a day 04/05/20   Lazaro Arms, MD  Prenatal Vit-Fe Fumarate-FA (PRENATAL PO) Take by mouth.    [provider]  progesterone (PROMETRIUM) 200 MG capsule Place 1 capsule (200 mg total) vaginally at bedtime. 05/04/20   Cheral Marker, CNM    Allergies    Patient has no known allergies.  Review of Systems   Review of Systems  Constitutional:  Negative for fever.  HENT:  Negative for sore  throat.   Eyes:  Negative for visual disturbance.  Respiratory:  Negative for shortness of breath.   Cardiovascular:  Negative for chest pain.  Gastrointestinal:  Positive for abdominal pain. Negative for nausea.  Genitourinary:  Positive for vaginal bleeding. Negative for dysuria.  Musculoskeletal:  Negative for back pain.  Skin:  Negative for rash.  Neurological:  Negative for headaches.   Physical Exam Updated Vital Signs BP (!) 157/110   Pulse (!) 107   Temp 98.4 F (36.9 C) (Oral)   Resp (!) 22 Comment: Simultaneous filing. User may not have seen previous data.  Ht 5\' 6"  (1.676 m)   Wt 81.6 kg   LMP 11/08/2019   SpO2 100%   BMI 29.05 kg/m   Physical Exam Vitals and nursing note reviewed.  Constitutional:      General: She is not in acute distress.    Appearance: Normal appearance. She is well-developed.  HENT:     Head: Normocephalic and atraumatic.  Eyes:      Conjunctiva/sclera: Conjunctivae normal.  Cardiovascular:     Rate and Rhythm: Normal rate and regular rhythm.     Heart sounds: No murmur heard. Pulmonary:     Effort: Pulmonary effort is normal. No respiratory distress.     Breath sounds: Normal breath sounds.  Abdominal:     Tenderness: There is no abdominal tenderness.     Comments: Gravid abdomen.  Nontender.  Genitourinary:    Comments: Vaginal exam with clot in vagina.  Do not feel any fetal engagement or cervix. Musculoskeletal:        General: No deformity or signs of injury. Normal range of motion.     Cervical back: Neck supple.  Skin:    General: Skin is warm and dry.  Neurological:     General: No focal deficit present.     Mental Status: She is alert.    ED Results / Procedures / Treatments   Labs (all labs ordered are listed, but only abnormal results are displayed) Labs Reviewed  CBC WITH DIFFERENTIAL/PLATELET - Abnormal; Notable for the following components:      Result Value   WBC 10.7 (*)    RBC 5.40 (*)    RDW 15.6 (*)    Abs Immature Granulocytes 0.10 (*)    All other components within normal limits  RESP PANEL BY RT-PCR (FLU A&B, COVID) [id number]  COMPREHENSIVE METABOLIC PANEL  TYPE AND SCREEN    EKG None  Radiology No results found.  Procedures .Critical Care  Date/Time: 06/18/2020 10:23 AM Performed by: 06/20/2020, MD Authorized by: Terrilee Files, MD   Critical care provider statement:    Critical care time (minutes):  45   Critical care time was exclusive of:  Separately billable procedures and treating other patients   Critical care was necessary to treat or prevent imminent or life-threatening deterioration of the following conditions: placental abruption vs precip delivery.   Critical care was time spent personally by me on the following activities:  Discussions with consultants, evaluation of patient's response to treatment, examination of patient, ordering and performing  treatments and interventions, ordering and review of laboratory studies, ordering and review of radiographic studies, pulse oximetry, re-evaluation of patient's condition, obtaining history from patient or surrogate, review of old charts and development of treatment plan with patient or surrogate   I assumed direction of critical care for this patient from another provider in my specialty: no   Ultrasound ED Abd  Date/Time: 06/18/2020 10:31  AM Performed by: Terrilee Files, MD Authorized by: Terrilee Files, MD   Procedure details:     Images: not archived    Comments:     ED ultrasound multiple times trying to ascertain fetal cardiac activity.  Able to visualize thorax without any cardiac activity noted.   Medications Ordered in ED Medications  0.9 %  sodium chloride infusion ( Intravenous New Bag/Given 06/17/20 1847)    ED Course  I have reviewed the triage vital signs and the nursing notes.  Pertinent labs & imaging results that were available during my care of the patient were reviewed by me and considered in my medical decision making (see chart for details).  Clinical Course as of 06/18/20 1022  Thu Jun 17, 2020  1911 Nurses unable to obtain fetal heart rate.  Initially we thought it was 50.  My bedside ultrasound I am not seeing any cardiac activity although not getting great images.  Toco applied and OB rapid response team is concerned she has had placental abruption.  Discussed with Dr. Charlotta Newton, recommends transport to [facility].  Transport team alerted. [MB]    Clinical Course User Index [MB] Terrilee Files, MD   MDM Rules/Calculators/A&P                         This patient complains of crampy low abdominal pain and vaginal bleeding in the setting of third trimester pregnancy; this involves an extensive number of treatment Options and is a complaint that carries with it a high risk of complications and Morbidity. The differential includes precipitous  delivery, placental abruption, bloody show  I ordered, reviewed and interpreted labs, which included CBC with mildly elevated white count normal hemoglobin, chemistries normal other than mildly low glucose with normal sensorium, COVID and flu testing negative, blood type O+ Previous records obtained and reviewed in epic, no recent admissions I consulted Dr. Charlotta Newton OB on-call and discussed lab and imaging findings  Critical Interventions: Work-up and management of patient's third trimester vaginal bleeding including multiple ED ultrasounds trying to ascertain fetal viability  After the interventions stated above, I reevaluated the patient and found patient to be hemodynamically stable.  No further bleeding.  She will [facility] by [facility] evaluation.   Final Clinical Impression(s) / ED Diagnoses Final diagnoses:  Placental abruption in third trimester    Rx / DC Orders ED Discharge Orders     None        Terrilee Files, MD 06/18/20 1032

## 2020-06-17 NOTE — Progress Notes (Addendum)
Notified by [facility] ED that patient arrives c/o passing blood clots. ED provider checked cervix and 'baby still feels high'. Pt. Denies LOF. Dr. Charm Barges communicated to [facility] RN by phone that he got a FHR of by bedside ultrasound ~160 bpm. ED RN Asher Muir states they are unable to find FHR with monitor. Toco applied. Dr. Charlotta Newton already notified by ED that patient is en route to [facility] hospital for evaluation.

## 2020-06-17 NOTE — MAU Note (Signed)
Pt arrived via [facility] from [facility]. Sent for eval of fetal heartones (none heard in ED at [facility]) and sudden onset bleeding w/clots while at home earllier. Also hypertensive at [facility].Pt transferred to bed from stretcher, had clots in pad. Patient alert and oriented w/fluids running.

## 2020-06-17 NOTE — H&P (Signed)
LABOR AND DELIVERY ADMISSION HISTORY AND PHYSICAL NOTE  Debra Glass is a 35 y.o. at 4649w5d by 11wk US with PMHx notable for gestational HTN, IUGR, and hx preterm delivery, who presents for IUFD.    Patient reports that earlier today she was sitting on the couch when all of a sudden she had intense cramping and had a gush of blood. Since then has continued to have bleeding and passing clots.   [facility] and bedside US showed no fetal heart tones. [facility] On arrival to [facility] bedside US performed that confirmed no fetal cardiac activity and large placental abruption.   Prenatal History/Complications: [facility]:  @[redacted]w[redacted]d , CWD, normal anatomy, cephalic presentation, anterior placenta, 4.1%ile, EFW 1304g. UAD 99.9% with AEDF, no REDF  Pregnancy complications:  - gestational hypertension, diagnosed at 28 weeks - IUGR 4% diagnosed 30 weeks with elevated UA dopplers and AEDF - hx of preterm delivery  Past Medical History: Past Medical History:  Diagnosis Date   Medical history non-contributory     Past Surgical History: Past Surgical History:  Procedure Laterality Date   DILATION AND CURETTAGE OF UTERUS      Obstetrical History: OB History     Gravida  4   Para  2   Term      Preterm  2   AB  1   Living  2      SAB  1   IAB      Ectopic      Multiple      Live Births  2           Social History: Social History   Socioeconomic History   Marital status: Single    Spouse name: Not on file   Number of children: Not on file   Years of education: Not on file   Highest education level: Not on file  Occupational History   Not on file  Tobacco Use   Smoking status: Former    Pack years: 0.00    Types: Cigarettes   Smokeless tobacco: Never  Vaping Use   Vaping Use: Never used  Substance and Sexual Activity   Alcohol use: Not Currently    Comment: occasionally   Drug use: No   Sexual activity:  Yes    Birth control/protection: None  Other Topics Concern   Not on file  Social History Narrative   Not on file   Social Determinants of Health   Financial Resource Strain: Low Risk    Difficulty of Paying Living Expenses: Not hard at all  Food Insecurity: No Food Insecurity   Worried About Programme researcher, broadcasting/film/videounning Out of Food in the Last Year: Never true   Ran Out of Food in the Last Year: Never true  Transportation Needs: No Transportation Needs   Lack of Transportation (Medical): No   Lack of Transportation (Non-Medical): No  Physical Activity: Sufficiently Active   Days of Exercise per Week: 7 days   Minutes of Exercise per Session: 100 min  Stress: No Stress Concern Present   Feeling of Stress : Not at all  Social Connections: Moderately Isolated   Frequency of Communication with Friends and Family: More than three times a week   Frequency of Social Gatherings with Friends and Family: Once a week   Attends Religious Services: Never   Database administratorActive Member of Clubs or Organizations: No   Attends BankerClub or Organization Meetings: Never   Marital  Status: Living with partner    Family History: Family History  Problem Relation Age of Onset   Heart disease Maternal Grandmother        had a pacemaker   Cancer Maternal Grandfather     Allergies: No Known Allergies  Medications Prior to Admission  Medication Sig Dispense Refill Last Dose   Blood Pressure Monitor MISC For regular home bp monitoring during pregnancy 1 each 0    labetalol (NORMODYNE) 200 MG tablet Take 1 tablet (200 mg total) by mouth 2 (two) times daily. 60 tablet 3    omeprazole (PRILOSEC) 20 MG capsule Take 1 capsule (20 mg total) by mouth daily. 1 tablet a day 30 capsule 6    Prenatal Vit-Fe Fumarate-FA (PRENATAL PO) Take by mouth.      progesterone (PROMETRIUM) 200 MG capsule Place 1 capsule (200 mg total) vaginally at bedtime. 30 capsule 5      Review of Systems  All systems reviewed and negative except as stated in  HPI  Physical Exam Blood pressure (!) 97/57, pulse 63, temperature 98.4 F (36.9 C), temperature source Oral, resp. rate (!) 22, height 5\' 6"  (1.676 m), weight 81.6 kg, last menstrual period 11/08/2019, SpO2 98 %. General appearance: alert, oriented, NAD Lungs: normal respiratory effort Heart: regular rate Abdomen: soft, non-tender; gravid Extremities: No calf swelling or tenderness Presentation: cephalic by bedside 13/06/2019  Pt informed that the ultrasound is considered a limited OB ultrasound and is not intended to be a complete ultrasound exam.  Patient also informed that the ultrasound is not being completed with the intent of assessing for fetal or placental anomalies or any pelvic abnormalities.  Explained that the purpose of today's ultrasound is to assess for  viability.  Patient acknowledges the purpose of the exam and the limitations of the study.    My read: infant in cephalic presentation. No cardiac activity. Anterior placenta with large abruption.      Prenatal labs: ABO, Rh: --/--/O POS Performed at [facility], [address] [place], [place]  [phone]) Antibody: PENDING (06/16 1856) Rubella: 2.58 (02/11 1132) RPR: Non Reactive (05/25 0849)  HBsAg: Negative (02/11 1132)  HIV: Non Reactive (05/25 0849)  GC/Chlamydia: none recently  GBS:   none on file 2-hr GTT: normal Genetic screening:  low risk panorama Anatomy 05-02-2002: normal, IUGR  Prenatal Transfer Tool  Maternal Diabetes: No Genetic Screening: Normal Maternal Ultrasounds/Referrals: IUGR Fetal Ultrasounds or other Referrals:  None Maternal Substance Abuse:  No Significant Maternal Medications:  None Significant Maternal Lab Results: None  Results for orders placed or performed during the hospital encounter of 06/17/20 (from the past 24 hour(s))  Resp Panel by RT-PCR (Flu A&B, Covid) Nasopharyngeal Swab   Collection Time: 06/17/20  6:53 PM   Specimen: Nasopharyngeal Swab; Nasopharyngeal(NP)  swabs in vial transport medium  Result Value Ref Range   SARS Coronavirus 2 by RT PCR NEGATIVE NEGATIVE   Influenza A by PCR NEGATIVE NEGATIVE   Influenza B by PCR NEGATIVE NEGATIVE  CBC with Differential   Collection Time: 06/17/20  6:53 PM  Result Value Ref Range   WBC 10.7 (H) 4.0 - 10.5 K/uL   RBC 5.40 (H) 3.87 - 5.11 MIL/uL   Hemoglobin 14.5 12.0 - 15.0 g/dL   HCT 06/19/20 01.7 - 51.0 %   MCV 82.2 80.0 - 100.0 fL   MCH 26.9 26.0 - 34.0 pg   MCHC 32.7 30.0 - 36.0 g/dL   RDW 25.8 (H) 52.7 -  15.5 %   Platelets 229 150 - 400 K/uL   nRBC 0.0 0.0 - 0.2 %   Neutrophils Relative % 64 %   Neutro Abs 6.9 1.7 - 7.7 K/uL   Lymphocytes Relative 26 %   Lymphs Abs 2.8 0.7 - 4.0 K/uL   Monocytes Relative 7 %   Monocytes Absolute 0.8 0.1 - 1.0 K/uL   Eosinophils Relative 1 %   Eosinophils Absolute 0.1 0.0 - 0.5 K/uL   Basophils Relative 1 %   Basophils Absolute 0.1 0.0 - 0.1 K/uL   Immature Granulocytes 1 %   Abs Immature Granulocytes 0.10 (H) 0.00 - 0.07 K/uL  Comprehensive metabolic panel   Collection Time: 06/17/20  6:53 PM  Result Value Ref Range   Sodium 137 135 - 145 mmol/L   Potassium 3.5 3.5 - 5.1 mmol/L   Chloride 105 98 - 111 mmol/L   CO2 25 22 - 32 mmol/L   Glucose, Bld 54 (L) 70 - 99 mg/dL   BUN 9 6 - 20 mg/dL   Creatinine, Ser 2.99 0.44 - 1.00 mg/dL   Calcium 9.0 8.9 - 37.1 mg/dL   Total Protein 7.0 6.5 - 8.1 g/dL   Albumin 3.3 (L) 3.5 - 5.0 g/dL   AST 16 15 - 41 U/L   ALT 14 0 - 44 U/L   Alkaline Phosphatase 128 (H) 38 - 126 U/L   Total Bilirubin 0.4 0.3 - 1.2 mg/dL   GFR, Estimated >69 >67 mL/min   Anion gap 7 5 - 15  Type and screen [facility]   Collection Time: 06/17/20  6:56 PM  Result Value Ref Range   ABO/RH(D)      O POS Performed at [facility], [address] [place], [place] [address]    Antibody Screen PENDING    Sample Expiration      06/20/2020,2359 Performed at [facility], [address] [place], [place] [address]   Results  for orders placed or performed in visit on 06/17/20 (from the past 24 hour(s))  POC Urinalysis Dipstick OB   Collection Time: 06/17/20 11:57 AM  Result Value Ref Range   Color, UA     Clarity, UA     Glucose, UA Negative Negative   Bilirubin, UA     Ketones, UA neg    Spec Grav, UA     Blood, UA neg    pH, UA     POC,PROTEIN,UA Negative Negative, Trace, Small (1+), Moderate (2+), Large (3+), 4+   Urobilinogen, UA     Nitrite, UA neg    Leukocytes, UA Negative Negative   Appearance     Odor      Patient Active Problem List   Diagnosis Date Noted   IUGR (intrauterine growth restriction) affecting care of mother 06/10/2020   Gestational hypertension 05/26/2020   Abnormal chromosomal and genetic finding on antenatal screening mother 03/09/2020   History of preterm delivery 02/13/2020   Supervision of other high risk pregnancies, first trimester 02/12/2020    Assessment: FOREST PRUDEN is a 35 y.o. [id number] at [redacted]w[redacted]d presenting with IUFD secondary to placental abruption.   #IUFD: - secondary to placental abruption. Recently diagnosed with gestational hypertension but no severe BP's of late. - obtain routine labs: DIC panel, UDS - admit for induction  #Labor: hx of two prior vaginal deliveries. Induce w cytotec #Pain: IV pain meds PRN, epidural upon request #COVID: swab pending #MOC: discuss post partum #gHTN: CBC, CMP, UPCR on admission  Mary Sella [facility] 06/17/2020, 8:06 PM

## 2020-06-17 NOTE — ED Notes (Addendum)
Dr. Charm Barges at bedside for [facility] screening. Unable to obtain FHR with toco monitor or doppler. Continue to monitor.

## 2020-06-17 NOTE — Progress Notes (Signed)
Monitor showing no fetal heart activity. [facility] RN called back to [facility] and provider at bedside with ultrasound with no visible cardiac activity. Contraction pattern shown to attending OB Dr. Charlotta Newton and about no cardiac activity. Pt. En route at this time by EMS to MAU. MAU charge RN notified of patient.

## 2020-06-17 NOTE — Progress Notes (Signed)
Patient ID: Michaele Offer, female   DOB: 1985/11/27, 35 y.o.   MRN: [id number] Patient stable with husband at bedside   Condolences offered  Vitals:   06/17/20 2026 06/17/20 2044 06/17/20 2103 06/17/20 2208  BP: 113/73 118/69 134/78 (!) 142/75  Pulse: 69 61 62 75  Resp:   16 18  Temp:    99 F (37.2 C)  TempSrc:    Oral  SpO2: 99%  99%   Weight:   80.7 kg   Height:   5\' 6"  (1.676 m)    Abdomen mod firm, tender Moderate vaginal bleeding  Dilation: 1.5 Effacement (%): 70 Cervical Position: Middle Station: -3 Presentation: Vertex Exam by:: [email or web address] CNM  Attempted Foley insertion unsuccessfully Will start with Cytotec Artelia Laroche PO  Offered early epidural, declines for now

## 2020-06-18 ENCOUNTER — Inpatient Hospital Stay (HOSPITAL_COMMUNITY): Payer: Medicaid Other | Admitting: Anesthesiology

## 2020-06-18 ENCOUNTER — Encounter (HOSPITAL_COMMUNITY)
Admission: EM | Disposition: A | Discharge status: Home / Self Care | Payer: Self-pay | Source: Home / Self Care | Attending: Obstetrics and Gynecology

## 2020-06-18 ENCOUNTER — Encounter (HOSPITAL_COMMUNITY): Payer: Self-pay | Admitting: Obstetrics & Gynecology

## 2020-06-18 DIAGNOSIS — O364XX0 Maternal care for intrauterine death, not applicable or unspecified: Secondary | ICD-10-CM

## 2020-06-18 DIAGNOSIS — Z3A31 31 weeks gestation of pregnancy: Secondary | ICD-10-CM

## 2020-06-18 DIAGNOSIS — O365930 Maternal care for other known or suspected poor fetal growth, third trimester, not applicable or unspecified: Secondary | ICD-10-CM

## 2020-06-18 DIAGNOSIS — O134 Gestational [pregnancy-induced] hypertension without significant proteinuria, complicating childbirth: Secondary | ICD-10-CM

## 2020-06-18 LAB — DIC (DISSEMINATED INTRAVASCULAR COAGULATION)PANEL
D-Dimer, Quant: >20 ug/mL-FEU — ABNORMAL HIGH (ref 0.00–0.50)
D-Dimer, Quant: >20 ug/mL-FEU — ABNORMAL HIGH (ref 0.00–0.50)
Fibrinogen: 198 mg/dL — ABNORMAL LOW (ref 210–475)
Fibrinogen: 219 mg/dL (ref 210–475)
INR: 1.2 (ref 0.8–1.2)
INR: 1.1 (ref 0.8–1.2)
Platelets: 148 10*3/uL — ABNORMAL LOW (ref 150–400)
Platelets: 144 10*3/uL — ABNORMAL LOW (ref 150–400)
Prothrombin Time: 14.7 seconds (ref 11.4–15.2)
Prothrombin Time: 14.7 seconds (ref 11.4–15.2)
Smear Review: NONE SEEN
aPTT: 31 seconds (ref 24–36)
aPTT: 28 seconds (ref 24–36)

## 2020-06-18 LAB — CBC
HCT: 32.9 % — ABNORMAL LOW (ref 36.0–46.0)
HCT: 29.1 % — ABNORMAL LOW (ref 36.0–46.0)
HCT: 30.7 % — ABNORMAL LOW (ref 36.0–46.0)
Hemoglobin: 11.2 g/dL — ABNORMAL LOW (ref 12.0–15.0)
Hemoglobin: 9.8 g/dL — ABNORMAL LOW (ref 12.0–15.0)
Hemoglobin: 10.3 g/dL — ABNORMAL LOW (ref 12.0–15.0)
MCH: 27.3 pg (ref 26.0–34.0)
MCH: 27.2 pg (ref 26.0–34.0)
MCH: 27.1 pg (ref 26.0–34.0)
MCHC: 34 g/dL (ref 30.0–36.0)
MCHC: 33.7 g/dL (ref 30.0–36.0)
MCHC: 33.6 g/dL (ref 30.0–36.0)
MCV: 80 fL (ref 80.0–100.0)
MCV: 80.8 fL (ref 80.0–100.0)
MCV: 80.8 fL (ref 80.0–100.0)
Platelets: 147 10*3/uL — ABNORMAL LOW (ref 150–400)
Platelets: 145 10*3/uL — ABNORMAL LOW (ref 150–400)
Platelets: 158 10*3/uL (ref 150–400)
RBC: 4.11 MIL/uL (ref 3.87–5.11)
RBC: 3.6 MIL/uL — ABNORMAL LOW (ref 3.87–5.11)
RBC: 3.8 MIL/uL — ABNORMAL LOW (ref 3.87–5.11)
RDW: 15.1 % (ref 11.5–15.5)
RDW: 15.1 % (ref 11.5–15.5)
RDW: 15.2 % (ref 11.5–15.5)
WBC: 24.5 10*3/uL — ABNORMAL HIGH (ref 4.0–10.5)
WBC: 19.5 10*3/uL — ABNORMAL HIGH (ref 4.0–10.5)
WBC: 21.7 10*3/uL — ABNORMAL HIGH (ref 4.0–10.5)
nRBC: 0 % (ref 0.0–0.2)
nRBC: 0 % (ref 0.0–0.2)
nRBC: 0 % (ref 0.0–0.2)

## 2020-06-18 LAB — PREPARE RBC (CROSSMATCH)

## 2020-06-18 LAB — COMPREHENSIVE METABOLIC PANEL
ALT: 13 U/L (ref 0–44)
AST: 21 U/L (ref 15–41)
Albumin: 2.7 g/dL — ABNORMAL LOW (ref 3.5–5.0)
Alkaline Phosphatase: 93 U/L (ref 38–126)
Anion gap: 8 (ref 5–15)
BUN: 6 mg/dL (ref 6–20)
CO2: 21 mmol/L — ABNORMAL LOW (ref 22–32)
Calcium: 9 mg/dL (ref 8.9–10.3)
Chloride: 106 mmol/L (ref 98–111)
Creatinine, Ser: 0.73 mg/dL (ref 0.44–1.00)
GFR, Estimated: >60 mL/min (ref >60)
Glucose, Bld: 149 mg/dL — ABNORMAL HIGH (ref 70–99)
Potassium: 4.2 mmol/L (ref 3.5–5.1)
Sodium: 135 mmol/L (ref 135–145)
Total Bilirubin: 0.6 mg/dL (ref 0.3–1.2)
Total Protein: 5.4 g/dL — ABNORMAL LOW (ref 6.5–8.1)

## 2020-06-18 SURGERY — Surgical Case
Anesthesia: General

## 2020-06-18 MED ORDER — PROMETHAZINE HCL 25 MG/ML IJ SOLN: 6.2500 mg | INTRAMUSCULAR | Status: DC | PRN

## 2020-06-18 MED ORDER — CEFAZOLIN SODIUM-DEXTROSE 2-3 GM-%(50ML) IV SOLR
INTRAVENOUS | Status: DC | PRN
  Administered 2020-06-18: 2 g via INTRAVENOUS

## 2020-06-18 MED ORDER — SUCCINYLCHOLINE CHLORIDE 200 MG/10ML IV SOSY
PREFILLED_SYRINGE | INTRAVENOUS | Status: AC
  Filled 2020-06-18: qty 10

## 2020-06-18 MED ORDER — SODIUM CHLORIDE 0.9 % IV SOLN: INTRAVENOUS | Status: DC | PRN

## 2020-06-18 MED ORDER — SODIUM CHLORIDE 0.9% IV SOLUTION: Freq: Once | INTRAVENOUS | Status: DC

## 2020-06-18 MED ORDER — ROCURONIUM BROMIDE 10 MG/ML (PF) SYRINGE
PREFILLED_SYRINGE | INTRAVENOUS | Status: AC
  Filled 2020-06-18: qty 10

## 2020-06-18 MED ORDER — ONDANSETRON HCL 4 MG/2ML IJ SOLN
INTRAMUSCULAR | Status: DC | PRN
  Administered 2020-06-18: 4 mg via INTRAVENOUS

## 2020-06-18 MED ORDER — DEXAMETHASONE SODIUM PHOSPHATE 10 MG/ML IJ SOLN
INTRAMUSCULAR | Status: DC | PRN
  Administered 2020-06-18: 10 mg via INTRAVENOUS

## 2020-06-18 MED ORDER — FENTANYL CITRATE (PF) 100 MCG/2ML IJ SOLN
INTRAMUSCULAR | Status: DC | PRN
  Administered 2020-06-18: 100 ug via INTRAVENOUS

## 2020-06-18 MED ORDER — PROPOFOL 10 MG/ML IV BOLUS
INTRAVENOUS | Status: AC
  Filled 2020-06-18: qty 20

## 2020-06-18 MED ORDER — MIDAZOLAM HCL 2 MG/2ML IJ SOLN
INTRAMUSCULAR | Status: AC
  Filled 2020-06-18: qty 2

## 2020-06-18 MED ORDER — ENOXAPARIN SODIUM 40 MG/0.4ML IJ SOSY
40.0000 mg | PREFILLED_SYRINGE | INTRAMUSCULAR | Status: DC
  Administered 2020-06-18 – 2020-06-19 (×2): 40 mg via SUBCUTANEOUS
  Filled 2020-06-18 (×2): qty 0.4

## 2020-06-18 MED ORDER — OXYCODONE HCL 5 MG PO TABS
5.0000 mg | ORAL_TABLET | ORAL | Status: DC | PRN
  Administered 2020-06-18: 10 mg via ORAL
  Filled 2020-06-18: qty 1

## 2020-06-18 MED ORDER — MORPHINE SULFATE (PF) 0.5 MG/ML IJ SOLN
INTRAMUSCULAR | Status: AC
  Filled 2020-06-18: qty 10

## 2020-06-18 MED ORDER — LIDOCAINE HCL (CARDIAC) PF 100 MG/5ML IV SOSY
PREFILLED_SYRINGE | INTRAVENOUS | Status: DC | PRN
  Administered 2020-06-18: 100 mg via INTRAVENOUS

## 2020-06-18 MED ORDER — TRANEXAMIC ACID-NACL 1000-0.7 MG/100ML-% IV SOLN
INTRAVENOUS | Status: DC | PRN
  Administered 2020-06-18: 1000 mg via INTRAVENOUS

## 2020-06-18 MED ORDER — GABAPENTIN 100 MG PO CAPS
100.0000 mg | ORAL_CAPSULE | Freq: Two times a day (BID) | ORAL | Status: DC
  Administered 2020-06-19 (×2): 100 mg via ORAL
  Filled 2020-06-18 (×2): qty 1

## 2020-06-18 MED ORDER — OXYTOCIN-SODIUM CHLORIDE 30-0.9 UT/500ML-% IV SOLN
INTRAVENOUS | Status: AC
  Filled 2020-06-18: qty 500

## 2020-06-18 MED ORDER — WITCH HAZEL-GLYCERIN EX PADS
1.0000 | MEDICATED_PAD | CUTANEOUS | Status: DC | PRN
Start: 2020-06-18 — End: 2020-06-20

## 2020-06-18 MED ORDER — DEXAMETHASONE SODIUM PHOSPHATE 10 MG/ML IJ SOLN
INTRAMUSCULAR | Status: AC
  Filled 2020-06-18: qty 2

## 2020-06-18 MED ORDER — PRENATAL MULTIVITAMIN CH
1.0000 | ORAL_TABLET | Freq: Every day | ORAL | Status: DC
  Administered 2020-06-18 – 2020-06-19 (×2): 1 via ORAL
  Filled 2020-06-18 (×2): qty 1

## 2020-06-18 MED ORDER — ACETAMINOPHEN 500 MG PO TABS
1000.0000 mg | ORAL_TABLET | Freq: Four times a day (QID) | ORAL | Status: DC
  Administered 2020-06-18 – 2020-06-20 (×8): 1000 mg via ORAL
  Filled 2020-06-18 (×9): qty 2

## 2020-06-18 MED ORDER — SOD CITRATE-CITRIC ACID 500-334 MG/5ML PO SOLN
ORAL | Status: AC
  Filled 2020-06-18: qty 15

## 2020-06-18 MED ORDER — ACETAMINOPHEN 10 MG/ML IV SOLN: 1000.0000 mg | Freq: Once | INTRAVENOUS | Status: DC | PRN

## 2020-06-18 MED ORDER — MENTHOL 3 MG MT LOZG: 1.0000 | LOZENGE | OROMUCOSAL | Status: DC | PRN

## 2020-06-18 MED ORDER — PROPOFOL 10 MG/ML IV BOLUS
INTRAVENOUS | Status: DC | PRN
  Administered 2020-06-18: 140 mg via INTRAVENOUS

## 2020-06-18 MED ORDER — METHYLERGONOVINE MALEATE 0.2 MG/ML IJ SOLN
INTRAMUSCULAR | Status: DC | PRN
  Administered 2020-06-18: .2 mg via INTRAMUSCULAR

## 2020-06-18 MED ORDER — OXYTOCIN-SODIUM CHLORIDE 30-0.9 UT/500ML-% IV SOLN: 2.5000 [IU]/h | INTRAVENOUS | Status: AC

## 2020-06-18 MED ORDER — METHYLERGONOVINE MALEATE 0.2 MG/ML IJ SOLN
INTRAMUSCULAR | Status: AC
  Filled 2020-06-18: qty 1

## 2020-06-18 MED ORDER — OXYTOCIN-SODIUM CHLORIDE 30-0.9 UT/500ML-% IV SOLN
INTRAVENOUS | Status: DC | PRN
  Administered 2020-06-18: 300 mL via INTRAVENOUS

## 2020-06-18 MED ORDER — ONDANSETRON HCL 4 MG/2ML IJ SOLN
INTRAMUSCULAR | Status: AC
  Filled 2020-06-18: qty 2

## 2020-06-18 MED ORDER — COCONUT OIL OIL: 1.0000 "application " | TOPICAL_OIL | Status: DC | PRN

## 2020-06-18 MED ORDER — FENTANYL CITRATE (PF) 100 MCG/2ML IJ SOLN
INTRAMUSCULAR | Status: AC
  Filled 2020-06-18: qty 2

## 2020-06-18 MED ORDER — ZOLPIDEM TARTRATE 5 MG PO TABS: 5.0000 mg | ORAL_TABLET | Freq: Every evening | ORAL | Status: DC | PRN

## 2020-06-18 MED ORDER — CEFAZOLIN SODIUM-DEXTROSE 2-4 GM/100ML-% IV SOLN
INTRAVENOUS | Status: AC
  Filled 2020-06-18: qty 100

## 2020-06-18 MED ORDER — DIPHENHYDRAMINE HCL 25 MG PO CAPS: 25.0000 mg | ORAL_CAPSULE | Freq: Four times a day (QID) | ORAL | Status: DC | PRN

## 2020-06-18 MED ORDER — SIMETHICONE 80 MG PO CHEW: 80.0000 mg | CHEWABLE_TABLET | ORAL | Status: DC | PRN

## 2020-06-18 MED ORDER — SENNOSIDES-DOCUSATE SODIUM 8.6-50 MG PO TABS
2.0000 | ORAL_TABLET | Freq: Every day | ORAL | Status: DC
  Administered 2020-06-18 – 2020-06-19 (×2): 2 via ORAL
  Filled 2020-06-18 (×2): qty 2

## 2020-06-18 MED ORDER — FENTANYL CITRATE (PF) 250 MCG/5ML IJ SOLN
INTRAMUSCULAR | Status: AC
  Filled 2020-06-18: qty 5

## 2020-06-18 MED ORDER — SUCCINYLCHOLINE CHLORIDE 20 MG/ML IJ SOLN
INTRAMUSCULAR | Status: DC | PRN
  Administered 2020-06-18: 120 mg via INTRAVENOUS

## 2020-06-18 MED ORDER — SIMETHICONE 80 MG PO CHEW
80.0000 mg | CHEWABLE_TABLET | Freq: Three times a day (TID) | ORAL | Status: DC
  Administered 2020-06-18 – 2020-06-19 (×4): 80 mg via ORAL
  Filled 2020-06-18 (×5): qty 1

## 2020-06-18 MED ORDER — TETANUS-DIPHTH-ACELL PERTUSSIS 5-2.5-18.5 LF-MCG/0.5 IM SUSY: 0.5000 mL | PREFILLED_SYRINGE | Freq: Once | INTRAMUSCULAR | Status: DC

## 2020-06-18 MED ORDER — ROCURONIUM BROMIDE 100 MG/10ML IV SOLN
INTRAVENOUS | Status: DC | PRN
  Administered 2020-06-18: 30 mg via INTRAVENOUS

## 2020-06-18 MED ORDER — HYDROMORPHONE HCL 1 MG/ML IJ SOLN
INTRAMUSCULAR | Status: AC
  Filled 2020-06-18: qty 0.5

## 2020-06-18 MED ORDER — LIDOCAINE 2% (20 MG/ML) 5 ML SYRINGE
INTRAMUSCULAR | Status: AC
  Filled 2020-06-18: qty 10

## 2020-06-18 MED ORDER — HYDROMORPHONE HCL 1 MG/ML IJ SOLN
0.2500 mg | INTRAMUSCULAR | Status: DC | PRN
  Administered 2020-06-18: 0.25 mg via INTRAVENOUS

## 2020-06-18 MED ORDER — SUGAMMADEX SODIUM 200 MG/2ML IV SOLN
INTRAVENOUS | Status: DC | PRN
  Administered 2020-06-18: 200 mg via INTRAVENOUS

## 2020-06-18 MED ORDER — DIBUCAINE (PERIANAL) 1 % EX OINT: 1.0000 "application " | TOPICAL_OINTMENT | CUTANEOUS | Status: DC | PRN

## 2020-06-18 MED ORDER — MIDAZOLAM HCL 2 MG/2ML IJ SOLN
INTRAMUSCULAR | Status: DC | PRN
  Administered 2020-06-18: 2 mg via INTRAVENOUS

## 2020-06-18 SURGICAL SUPPLY — 40 items
BENZOIN TINCTURE PRP APPL 2/3 (GAUZE/BANDAGES/DRESSINGS) ×3 IMPLANT
CHLORAPREP W/TINT 26ML (MISCELLANEOUS) ×3 IMPLANT
CLAMP CORD UMBIL (MISCELLANEOUS) IMPLANT
CLOSURE STERI STRIP 1/2 X4 (GAUZE/BANDAGES/DRESSINGS) ×2 IMPLANT
CLOSURE WOUND 1/2 X4 (GAUZE/BANDAGES/DRESSINGS) ×1
CLOTH BEACON ORANGE TIMEOUT ST (SAFETY) ×3 IMPLANT
DERMABOND ADVANCED (GAUZE/BANDAGES/DRESSINGS)
DERMABOND ADVANCED .7 DNX12 (GAUZE/BANDAGES/DRESSINGS) IMPLANT
DRSG OPSITE POSTOP 4X10 (GAUZE/BANDAGES/DRESSINGS) ×3 IMPLANT
ELECT REM PT RETURN 9FT ADLT (ELECTROSURGICAL) ×3
ELECTRODE REM PT RTRN 9FT ADLT (ELECTROSURGICAL) ×1 IMPLANT
EXTRACTOR VACUUM KIWI (MISCELLANEOUS) IMPLANT
GAUZE SPONGE 4X4 12PLY STRL LF (GAUZE/BANDAGES/DRESSINGS) ×6 IMPLANT
GLOVE BIOGEL PI IND STRL 7.0 (GLOVE) ×3 IMPLANT
GLOVE BIOGEL PI INDICATOR 7.0 (GLOVE) ×6
GLOVE ECLIPSE 6.5 STRL STRAW (GLOVE) ×3 IMPLANT
GOWN STRL REUS W/TWL LRG LVL3 (GOWN DISPOSABLE) ×9 IMPLANT
HEMOSTAT ARISTA ABSORB 3G PWDR (HEMOSTASIS) ×3 IMPLANT
KIT ABG SYR 3ML LUER SLIP (SYRINGE) IMPLANT
NEEDLE HYPO 25X5/8 SAFETYGLIDE (NEEDLE) IMPLANT
NS IRRIG 1000ML POUR BTL (IV SOLUTION) ×3 IMPLANT
PACK C SECTION WH (CUSTOM PROCEDURE TRAY) ×3 IMPLANT
PAD ABD 7.5X8 STRL (GAUZE/BANDAGES/DRESSINGS) ×3 IMPLANT
PAD OB MATERNITY 4.3X12.25 (PERSONAL CARE ITEMS) ×3 IMPLANT
PENCIL SMOKE EVAC W/HOLSTER (ELECTROSURGICAL) ×3 IMPLANT
RTRCTR C-SECT PINK 25CM LRG (MISCELLANEOUS) ×3 IMPLANT
STRIP CLOSURE SKIN 1/2X4 (GAUZE/BANDAGES/DRESSINGS) ×2 IMPLANT
SUT PLAIN 0 NONE (SUTURE) IMPLANT
SUT PLAIN 2 0 XLH (SUTURE) IMPLANT
SUT VIC AB 0 CT1 27 (SUTURE) ×6
SUT VIC AB 0 CT1 27XBRD ANBCTR (SUTURE) ×2 IMPLANT
SUT VIC AB 0 CTX 36 (SUTURE) ×9
SUT VIC AB 0 CTX36XBRD ANBCTRL (SUTURE) ×3 IMPLANT
SUT VIC AB 2-0 CT1 27 (SUTURE) ×3
SUT VIC AB 2-0 CT1 TAPERPNT 27 (SUTURE) ×1 IMPLANT
SUT VIC AB 4-0 KS 27 (SUTURE) ×3 IMPLANT
TOWEL OR 17X24 6PK STRL BLUE (TOWEL DISPOSABLE) ×3 IMPLANT
TRAY FOLEY W/BAG SLVR 14FR LF (SET/KITS/TRAYS/PACK) IMPLANT
WATER STERILE IRR 1000ML POUR (IV SOLUTION) ×3 IMPLANT
YANKAUER SUCT BULB TIP NO VENT (SUCTIONS) ×3 IMPLANT

## 2020-06-18 NOTE — Progress Notes (Signed)
Pt and her family are in the comfort room with infant. Support provided.

## 2020-06-18 NOTE — Anesthesia Postprocedure Evaluation (Signed)
Anesthesia Post Note  Patient: Debra Glass  Procedure(s) Performed: CESAREAN SECTION     Patient location during evaluation: PACU Anesthesia Type: General Level of consciousness: awake and alert and oriented Pain management: pain level controlled Vital Signs Assessment: post-procedure vital signs reviewed and stable Respiratory status: spontaneous breathing, nonlabored ventilation and respiratory function stable Cardiovascular status: blood pressure returned to baseline Postop Assessment: no apparent nausea or vomiting Anesthetic complications: no   No notable events documented.  Last Vitals:  Vitals:   06/18/20 0330 06/18/20 0345  BP: 106/63 126/80  Pulse: 93 86  Resp: 14 18  Temp:    SpO2: 100% 98%    Last Pain:  Vitals:   06/18/20 0332  TempSrc:   PainSc: 0-No pain   Pain Goal:                   Kaylyn Layer

## 2020-06-18 NOTE — Progress Notes (Signed)
Pt going outside in a wheelchair accompanied by significant other for "fresh air."

## 2020-06-18 NOTE — Anesthesia Preprocedure Evaluation (Signed)
Anesthesia Evaluation  Patient identified by MRN, date of birth, ID band Patient awake    Reviewed: Allergy & Precautions, NPO status , Patient's Chart, lab work & pertinent test results  History of Anesthesia Complications Negative for: history of anesthetic complications  Airway Mallampati: II  TM Distance: >3 FB Neck ROM: Full    Dental no notable dental hx.    Pulmonary former smoker,    Pulmonary exam normal        Cardiovascular hypertension (gestational), Normal cardiovascular exam     Neuro/Psych negative neurological ROS  negative psych ROS   GI/Hepatic negative GI ROS, Neg liver ROS,   Endo/Other  negative endocrine ROS  Renal/GU negative Renal ROS  negative genitourinary   Musculoskeletal negative musculoskeletal ROS (+)   Abdominal   Peds  Hematology DIC   Anesthesia Other Findings Day of surgery medications reviewed with patient.  Reproductive/Obstetrics (+) Pregnancy (IUFD)                             Anesthesia Physical Anesthesia Plan  ASA: 4 and emergent  Anesthesia Plan: General   Post-op Pain Management:    Induction: Intravenous, Rapid sequence and Cricoid pressure planned  PONV Risk Score and Plan: 4 or greater and Treatment may vary due to age or medical condition, Ondansetron, Dexamethasone and Midazolam  Airway Management Planned: Oral ETT  Additional Equipment:   Intra-op Plan:   Post-operative Plan: Extubation in OR  Informed Consent: I have reviewed the patients History and Physical, chart, labs and discussed the procedure including the risks, benefits and alternatives for the proposed anesthesia with the patient or authorized representative who has indicated his/her understanding and acceptance.     Dental advisory given  Plan Discussed with: CRNA  Anesthesia Plan Comments: (Labs drawn at 2029 suggestive of DIC. Patient with increased vaginal  bleeding and abdominal pain suggestive of intrauterine distention/ongoing bleeding. OB will proceed with urgent C/S. TEG performed preoperatively and blood products ordered for correction of coagulopathy. Plan for GETA. Patient in agreement with plan. Stephannie Peters, MD)        Anesthesia Quick Evaluation

## 2020-06-18 NOTE — Transfer of Care (Signed)
Immediate Anesthesia Transfer of Care Note  Patient: Debra Glass  Procedure(s) Performed: CESAREAN SECTION  Patient Location: PACU  Anesthesia Type:General  Level of Consciousness: awake, alert  and oriented  Airway & Oxygen Therapy: Patient Spontanous Breathing and Patient connected to nasal cannula oxygen  Post-op Assessment: Report given to RN and Post -op Vital signs reviewed and stable  Post vital signs: Reviewed and stable  Last Vitals:  Vitals Value Taken Time  BP 138/114 06/18/20 0246  Temp    Pulse 104 06/18/20 0250  Resp 30 06/18/20 0250  SpO2 99 % 06/18/20 0250  Vitals shown include unvalidated device data.  Last Pain:  Vitals:   06/17/20 2334  TempSrc: Oral  PainSc: 6          Complications: No notable events documented.

## 2020-06-18 NOTE — Progress Notes (Addendum)
CSW received consult due to IUFD.  CSW available for support secondary to Spiritual Care Services (CSW paged Spiritual Care dept) and will await call from Chaplain before becoming involved.  CSW screening out referral at this time.  Blaine Hamper, MSW, LCSW Clinical Social Work [phone]

## 2020-06-18 NOTE — Op Note (Signed)
PreOp Diagnosis:  1) Placental abruption 2) DIC 3) Fetal demise 4) Intrauterine pregnancy @ [redacted]w[redacted]d PostOp Diagnosis: same Procedure: Primary C-section Surgeon: Dr. Myna Hidalgo Assistant: Dr. Scheryl Darter Anesthesia: general Complications: none EBL: 267cc UOP: 300cc Fluids: 1300cc, 2u cryo, 1u plt  Findings: Female infant from vertex presentation, complete abruption noted.  Normal uterus,   PROCEDURE:  Informed consent was obtained from the patient with risks, benefits, complications, treatment options, and expected outcomes discussed with the patient.  The patient concurred with the proposed plan, giving informed consent with form signed.   The patient was taken to Operating Room, and identified with the procedure verified as C-Section Delivery with Time Out. With induction of anesthesia, the patient was prepped and draped in the usual sterile fashion. A Pfannenstiel incision was made and carried down through the subcutaneous tissue to the fascia. The fascia was incised in the midline and extended transversely. The superior aspect of the fascial incision was grasped with Kochers elevated and the underlying muscle dissected off. The inferior aspect of the facial incision was in similar fashion, grasped elevated and rectus muscles dissected off. The peritoneum was identified and entered. Peritoneal incision was extended longitudinally. The Alexis retractor was placed.  A low transverse uterine incision was made, blood clots were noted, the infant and placenta delivered together.   The uterine outline, tubes and ovaries appeared normal. The uterine incision was closed with running locked sutures of 0 Vicryl and a second layer of the same stitch was used in an imbricating fashion.  Interrupted figure of eight stitches were used to obtain hemostasis.  Excellent hemostasis was obtained.  Arista was placed. The fascia was then reapproximated with running sutures of 0 Vicryl. The skin was closed with 4-0  vicryl in a subcuticular fashion.  Instrument, sponge, and needle counts were correct prior the abdominal closure and at the conclusion of the case. The patient was taken to recovery in stable condition.   Myna Hidalgo, DO Attending Obstetrician & Gynecologist, [facility] for [employer], [facility]

## 2020-06-18 NOTE — Progress Notes (Addendum)
At bedside to review management.  Lab work concerning for DIC.  Discussed concern for active bleeding and need to proceed with delivery.Risk benefits and alternatives of cesarean section were discussed with the patient including but not limited to infection, bleeding, damage to bowel and bladder with the need for further surgery. Discussed likelihood of need for transfusion due to blood loss.  Also discussed if bleeding uncontrolled, only in emergency setting would consider hysterectomy.  Pt voiced understanding and desires to proceed.    Myna Hidalgo, DO Attending Obstetrician & Gynecologist, [facility] for [employer], [facility]

## 2020-06-18 NOTE — Progress Notes (Signed)
Daily Postpartum Note  Admission Date: 06/17/2020 Current Date: 06/18/2020 8:28 AM  Debra Glass is a 35 y.o. [id number] POD#0 s/p pLTCS (EBL w/ General anesthsia) @ [redacted]w[redacted]d for DIC. Patient admitted for IUFD, abruption diagnosis in the ED.  Pregnancy complicated by: Patient Active Problem List   Diagnosis Date Noted   Fetal demise affecting delivery 06/17/2020   IUGR (intrauterine growth restriction) affecting care of mother 06/10/2020   Gestational hypertension 05/26/2020   Abnormal chromosomal and genetic finding on antenatal screening mother 03/09/2020   History of preterm delivery 02/13/2020   Supervision of other high risk pregnancies, first trimester 02/12/2020    Overnight/24hr events:  S/p c/s at 0200  Subjective:  Taking some PO w/o difficulty. No ambulation yet, foley still in place, pain controlled, no nausea.   Objective:    Current Vital Signs 24h Vital Sign Ranges  T 98.5 F (36.9 C) Temp  Avg: 98.4 F (36.9 C)  Min: 97.5 F (36.4 C)  Max: 99 F (37.2 C)  BP (!) 126/58  BP  Min: 88/62  Max: 165/113  HR 74 Pulse  Avg: 80.5  Min: 61  Max: 109  RR 18 Resp  Avg: 19.3  Min: 14  Max: 28  SaO2 98 % Room Air SpO2  Avg: 98.3 %  Min: 85 %  Max: 100 %       24 Hour I/O Current Shift I/O  Time Ins Outs 06/16 0701 - 06/17 0700 In: 1737 [I.V.:1200] Out: 2868 [Urine:2175] 06/17 0701 - 06/17 1900 In: -  Out: 200 [Urine:200]   Patient Vitals for the past 24 hrs:  BP Temp Temp src Pulse Resp SpO2 Height Weight  06/18/20 0744 (!) 126/58 98.5 F (36.9 C) Oral 74 18 98 % -- --  06/18/20 0635 106/68 98.4 F (36.9 C) Oral 77 18 96 % -- --  06/18/20 0515 129/70 98.2 F (36.8 C) Oral 80 18 -- -- --  06/18/20 0410 (!) 131/58 98.3 F (36.8 C) Oral 78 20 -- -- --  06/18/20 0347 -- -- -- -- -- 99 % -- --  06/18/20 0345 126/80 -- -- 86 18 98 % -- --  06/18/20 0330 106/63 -- -- 93 14 100 % -- --  06/18/20 0315 (!) 152/79 98.7 F (37.1 C) -- (!) 109 20 100 % -- --   06/18/20 0302 -- -- -- -- -- 99 % -- --  06/18/20 0300 125/79 -- -- (!) 101 (!) 28 99 % -- --  06/18/20 0247 (!) 138/114 (!) 97.5 F (36.4 C) Oral (!) 106 (!) 21 92 % -- --  06/17/20 2334 135/69 98.4 F (36.9 C) Oral 76 -- -- -- --  06/17/20 2305 -- -- -- -- -- 94 % -- --  06/17/20 2300 -- -- -- -- -- 100 % -- --  06/17/20 2255 -- -- -- -- -- 100 % -- --  06/17/20 2250 -- -- -- -- -- 100 % -- --  06/17/20 2245 -- -- -- -- -- 100 % -- --  06/17/20 2240 -- -- -- -- -- 100 % -- --  06/17/20 2235 -- -- -- -- -- 100 % -- --  06/17/20 2208 (!) 142/75 99 F (37.2 C) Oral 75 18 -- -- --  06/17/20 2103 134/78 -- -- 62 16 99 % 5\' 6"  (1.676 m) 80.7 kg  06/17/20 2044 118/69 -- -- 61 -- -- -- --  06/17/20 2026 113/73 -- -- 69 -- 99 % -- --  06/17/20 2021 109/69 -- -- 74 -- 100 % -- --  06/17/20 2016 (!) 98/57 -- -- 62 -- (!) 85 % -- --  06/17/20 2011 (!) 98/55 -- -- 62 -- 100 % -- --  06/17/20 2006 (!) 88/62 -- -- 64 -- 94 % -- --  06/17/20 2001 (!) 97/57 -- -- 63 -- 98 % -- --  06/17/20 1956 103/61 -- -- 64 -- -- -- --  06/17/20 1918 -- -- -- 95 -- 100 % -- --  06/17/20 1917 -- -- -- 93 -- 100 % -- --  06/17/20 1916 -- -- -- 90 -- 100 % -- --  06/17/20 1900 129/85 -- -- 96 -- 100 % -- --  06/17/20 1846 (!) 157/110 -- -- -- -- -- -- --  06/17/20 1845 112/79 -- -- (!) 107 -- 100 % -- --  06/17/20 1840 -- -- -- -- -- -- 5\' 6"  (1.676 m) 81.6 kg  06/17/20 1838 (!) 165/113 98.4 F (36.9 C) Oral 91 (!) 22 100 % -- --   UOP: >118mL/hr  Physical exam: General: Well nourished, well developed female in no acute distress. Abdomen: rare bowel sounds, mild distension, c/d/I pressure dressing Cardiovascular: S1, S2 normal, no murmur, rub or gallop, regular rate and rhythm GU: no gross VB, clear UOP in tube Respiratory: CTAB Extremities: no clubbing, cyanosis or edema Skin: Warm and dry.   Medications: Current Facility-Administered Medications  Medication Dose Route Frequency Provider Last Rate  Last Admin   acetaminophen (TYLENOL) tablet 1,000 mg  1,000 mg Oral Q6H Ozan, Jennifer, DO   1,000 mg at 06/18/20 0514   coconut oil  1 application Topical PRN 06/20/20, DO       witch hazel-glycerin (TUCKS) pad 1 application  1 application Topical PRN Myna Hidalgo, DO       And   dibucaine (NUPERCAINAL) 1 % rectal ointment 1 application  1 application Rectal PRN Myna Hidalgo, DO       diphenhydrAMINE (BENADRYL) capsule 25 mg  25 mg Oral Q6H PRN Myna Hidalgo, DO       [START ON 06/19/2020] gabapentin (NEURONTIN) capsule 100 mg  100 mg Oral BID 06/21/2020, DO       menthol-cetylpyridinium (CEPACOL) lozenge 3 mg  1 lozenge Oral Q2H PRN Myna Hidalgo, DO       oxyCODONE (Oxy IR/ROXICODONE) immediate release tablet 5-10 mg  5-10 mg Oral Q4H PRN Myna Hidalgo, DO   10 mg at 06/18/20 06/20/20   oxytocin (PITOCIN) IV infusion 30 units in NS 500 mL - Premix  2.5 Units/hr Intravenous Continuous 9211, DO       prenatal multivitamin tablet 1 tablet  1 tablet Oral Q1200 Ozan, Jennifer, DO       senna-docusate (Senokot-S) tablet 2 tablet  2 tablet Oral Daily Ozan, Jennifer, DO       simethicone (MYLICON) chewable tablet 80 mg  80 mg Oral TID PC Ozan, Jennifer, DO       simethicone (MYLICON) chewable tablet 80 mg  80 mg Oral PRN Myna Hidalgo, DO       sodium citrate-citric acid (ORACIT) 500-334 MG/5ML solution            [START ON 06/19/2020] Tdap (BOOSTRIX) injection 0.5 mL  0.5 mL Intramuscular Once 06/21/2020, DO       zolpidem (AMBIEN) tablet 5 mg  5 mg Oral QHS PRN Myna Hidalgo, DO        Labs:  Recent Labs  Lab 06/17/20 2029 06/18/20 0254 06/18/20 0631  WBC 21.8* 24.5* 19.5*  HGB 11.5* 11.2* 9.8*  HCT 35.8* 32.9* 29.1*  PLT 126*  136* 148*  147* 144*  145*    Recent Labs  Lab 06/17/20 1853 06/17/20 2029  NA 137 138  K 3.5 4.0  CL 105 110  CO2 25 21*  BUN 9 10  CREATININE 0.68 0.81  CALCIUM 9.0 8.2*  PROT 7.0 4.7*  BILITOT 0.4 0.5  ALKPHOS 128* 100   ALT 14 9  AST 16 17  GLUCOSE 54* 109*   Normal Coags at 0630  Radiology:  none  Assessment & Plan:  Patient doing well *Postpartum: routine care. If continues to do well, then can d/c foley later this morning *Heme: repeat cbc and cmp at noon *GHTN: doing well on no meds *PPx: SCDs, hold lovenox for now *FEN/GI: regular diet, SLIV *Dispo: likely pod#2-3  Cornelia Copa MD Attending [facility] GYN Consult Phone: [phone] (M-F, 0800-1700) & [phone] (Off hours, weekends, holidays)

## 2020-06-18 NOTE — Anesthesia Procedure Notes (Signed)
Procedure Name: Intubation Date/Time: 06/18/2020 1:40 AM Performed by: Junious Silk, CRNA Pre-anesthesia Checklist: Patient identified, Emergency Drugs available, Suction available, Patient being monitored and Timeout performed Patient Re-evaluated:Patient Re-evaluated prior to induction Oxygen Delivery Method: Circle system utilized Preoxygenation: Pre-oxygenation with 100% oxygen Induction Type: IV induction and Rapid sequence Laryngoscope Size: Miller and 2 Grade View: Grade I Tube type: Oral Tube size: 7.0 mm Number of attempts: 1 Airway Equipment and Method: Stylet Placement Confirmation: ETT inserted through vocal cords under direct vision, positive ETCO2, CO2 detector and breath sounds checked- equal and bilateral Secured at: 20 cm Tube secured with: Tape Dental Injury: Teeth and Oropharynx as per pre-operative assessment

## 2020-06-19 LAB — BPAM CRYOPRECIPITATE
Blood Product Expiration Date: 202206170728
Blood Product Expiration Date: 202206170728
ISSUE DATE / TIME: 202206170153
ISSUE DATE / TIME: 202206170153
Unit Type and Rh: 6200
Unit Type and Rh: 6200

## 2020-06-19 LAB — PREPARE CRYOPRECIPITATE
Unit division: 0
Unit division: 0

## 2020-06-19 LAB — COMPREHENSIVE METABOLIC PANEL
ALT: 11 U/L (ref 0–44)
AST: 17 U/L (ref 15–41)
Albumin: 2.2 g/dL — ABNORMAL LOW (ref 3.5–5.0)
Alkaline Phosphatase: 66 U/L (ref 38–126)
Anion gap: 6 (ref 5–15)
BUN: 9 mg/dL (ref 6–20)
CO2: 25 mmol/L (ref 22–32)
Calcium: 8.6 mg/dL — ABNORMAL LOW (ref 8.9–10.3)
Chloride: 108 mmol/L (ref 98–111)
Creatinine, Ser: 0.79 mg/dL (ref 0.44–1.00)
GFR, Estimated: >60 mL/min (ref >60)
Glucose, Bld: 95 mg/dL (ref 70–99)
Potassium: 4.1 mmol/L (ref 3.5–5.1)
Sodium: 139 mmol/L (ref 135–145)
Total Bilirubin: 0.4 mg/dL (ref 0.3–1.2)
Total Protein: 4.6 g/dL — ABNORMAL LOW (ref 6.5–8.1)

## 2020-06-19 LAB — TYPE AND SCREEN
ABO/RH(D): O POS
Antibody Screen: NEGATIVE
Unit division: 0
Unit division: 0

## 2020-06-19 LAB — BPAM RBC
Blood Product Expiration Date: 202206232359
Blood Product Expiration Date: 202206232359
ISSUE DATE / TIME: 202206170131
ISSUE DATE / TIME: 202206170131
Unit Type and Rh: 5100
Unit Type and Rh: 5100

## 2020-06-19 LAB — CBC
HCT: 25 % — ABNORMAL LOW (ref 36.0–46.0)
Hemoglobin: 8.3 g/dL — ABNORMAL LOW (ref 12.0–15.0)
MCH: 27 pg (ref 26.0–34.0)
MCHC: 33.2 g/dL (ref 30.0–36.0)
MCV: 81.4 fL (ref 80.0–100.0)
Platelets: 142 10*3/uL — ABNORMAL LOW (ref 150–400)
RBC: 3.07 MIL/uL — ABNORMAL LOW (ref 3.87–5.11)
RDW: 15.6 % — ABNORMAL HIGH (ref 11.5–15.5)
WBC: 16.9 10*3/uL — ABNORMAL HIGH (ref 4.0–10.5)
nRBC: 0 % (ref 0.0–0.2)

## 2020-06-19 LAB — PREPARE PLATELET PHERESIS: Unit division: 0

## 2020-06-19 LAB — BPAM PLATELET PHERESIS
Blood Product Expiration Date: 202206172359
ISSUE DATE / TIME: 202206170130
Unit Type and Rh: 6200

## 2020-06-19 NOTE — Progress Notes (Signed)
POD #1 abruption/DIC/IUFD  Subjective: Postpartum Day 1: Cesarean Delivery Patient reports incisional pain, tolerating PO, and no problems voiding.    Objective: Vital signs in last 24 hours: Temp:  [98.3 F (36.8 C)-99.1 F (37.3 C)] 98.6 F (37 C) (06/18 0827) Pulse Rate:  [77-111] 89 (06/18 0827) Resp:  [18] 18 (06/18 0827) BP: (116-134)/(70-79) 134/79 (06/18 0827) SpO2:  [99 %-100 %] 100 % (06/18 0410)  Physical Exam:  General: alert, cooperative, and no distress Lochia: appropriate Uterine Fundus: firm Incision: healing well, no significant drainage DVT Evaluation: No evidence of DVT seen on physical exam.  Recent Labs    06/18/20 1130 06/19/20 0406  HGB 10.3* 8.3*  HCT 30.7* 25.0*    Assessment/Plan: Status post Cesarean section. Doing well postoperatively.  Continue current care. Anticipate discharge tomorrow, recheck CBC in am prior to discharge Doing well emotionally I feel  Lazaro Arms 06/19/2020, 10:02 AM

## 2020-06-20 LAB — CBC WITH DIFFERENTIAL/PLATELET
Abs Immature Granulocytes: 0.12 10*3/uL — ABNORMAL HIGH (ref 0.00–0.07)
Basophils Absolute: 0.1 10*3/uL (ref 0.0–0.1)
Basophils Relative: 0 %
Eosinophils Absolute: 0.2 10*3/uL (ref 0.0–0.5)
Eosinophils Relative: 1 %
HCT: 25.1 % — ABNORMAL LOW (ref 36.0–46.0)
Hemoglobin: 8.1 g/dL — ABNORMAL LOW (ref 12.0–15.0)
Immature Granulocytes: 1 %
Lymphocytes Relative: 30 %
Lymphs Abs: 4.2 10*3/uL — ABNORMAL HIGH (ref 0.7–4.0)
MCH: 26.6 pg (ref 26.0–34.0)
MCHC: 32.3 g/dL (ref 30.0–36.0)
MCV: 82.3 fL (ref 80.0–100.0)
Monocytes Absolute: 0.8 10*3/uL (ref 0.1–1.0)
Monocytes Relative: 6 %
Neutro Abs: 8.6 10*3/uL — ABNORMAL HIGH (ref 1.7–7.7)
Neutrophils Relative %: 62 %
Platelets: 157 10*3/uL (ref 150–400)
RBC: 3.05 MIL/uL — ABNORMAL LOW (ref 3.87–5.11)
RDW: 15.6 % — ABNORMAL HIGH (ref 11.5–15.5)
WBC: 13.9 10*3/uL — ABNORMAL HIGH (ref 4.0–10.5)
nRBC: 0 % (ref 0.0–0.2)

## 2020-06-20 MED ORDER — IBUPROFEN 600 MG PO TABS: 600.0000 mg | ORAL_TABLET | Freq: Four times a day (QID) | ORAL | 1 refills | Status: DC | PRN

## 2020-06-20 MED ORDER — OXYCODONE HCL 5 MG PO TABS: 5.0000 mg | ORAL_TABLET | ORAL | 0 refills | Status: DC | PRN

## 2020-06-20 NOTE — Plan of Care (Signed)
  Problem: Education: Goal: Knowledge of condition will improve Outcome: Adequate for Discharge Goal: Individualized Educational Video(s) Outcome: Adequate for Discharge Goal: Individualized Newborn Educational Video(s) Outcome: Adequate for Discharge   Problem: Activity: Goal: Will verbalize the importance of balancing activity with adequate rest periods Outcome: Adequate for Discharge Goal: Ability to tolerate increased activity will improve Outcome: Adequate for Discharge   Problem: Coping: Goal: Ability to identify and utilize available resources and services will improve Outcome: Adequate for Discharge   Problem: Life Cycle: Goal: Chance of risk for complications during the postpartum period will decrease Outcome: Adequate for Discharge   Problem: Role Relationship: Goal: Ability to demonstrate positive interaction with newborn will improve Outcome: Adequate for Discharge   Problem: Skin Integrity: Goal: Demonstration of wound healing without infection will improve Outcome: Adequate for Discharge   Problem: Education: Goal: Knowledge of General Education information will improve Description: Including pain rating scale, medication(s)/side effects and non-pharmacologic comfort measures Outcome: Adequate for Discharge   Problem: Health Behavior/Discharge Planning: Goal: Ability to manage health-related needs will improve Outcome: Adequate for Discharge   Problem: Clinical Measurements: Goal: Ability to maintain clinical measurements within normal limits will improve Outcome: Adequate for Discharge Goal: Will remain free from infection Outcome: Adequate for Discharge Goal: Diagnostic test results will improve Outcome: Adequate for Discharge Goal: Respiratory complications will improve Outcome: Adequate for Discharge Goal: Cardiovascular complication will be avoided Outcome: Adequate for Discharge   Problem: Activity: Goal: Risk for activity intolerance will  decrease Outcome: Adequate for Discharge   Problem: Nutrition: Goal: Adequate nutrition will be maintained Outcome: Adequate for Discharge   Problem: Coping: Goal: Level of anxiety will decrease Outcome: Adequate for Discharge   Problem: Elimination: Goal: Will not experience complications related to bowel motility Outcome: Adequate for Discharge Goal: Will not experience complications related to urinary retention Outcome: Adequate for Discharge   Problem: Pain Managment: Goal: General experience of comfort will improve Outcome: Adequate for Discharge   Problem: Safety: Goal: Ability to remain free from injury will improve Outcome: Adequate for Discharge   Problem: Skin Integrity: Goal: Risk for impaired skin integrity will decrease Outcome: Adequate for Discharge   Problem: Education: Goal: Knowledge of disease or condition will improve Outcome: Adequate for Discharge Goal: Knowledge of the prescribed therapeutic regimen will improve Outcome: Adequate for Discharge   Problem: Coping: Goal: Ability to identify and utilize appropriate coping strategies will improve Outcome: Adequate for Discharge Goal: Ability to identify and utilize available support systems will improve Outcome: Adequate for Discharge Goal: Will verbalize feelings Outcome: Adequate for Discharge Goal: Decrease level of anxiety will Outcome: Adequate for Discharge   Problem: Clinical Measurements: Goal: Will show no signs and symptoms of excessive bleeding Outcome: Adequate for Discharge

## 2020-06-20 NOTE — Progress Notes (Signed)
Reviewed discharge postpartum instructions with patient regarding medications, when to call MD/go to MAU, postpartum course, postpartum depression, signs and symptoms of pre-e, C-section site care, and to follow up with OB as scheduled for Friday this coming week. Patient verbalized understanding of discharge postpartum instructions and asked appropriate questions.

## 2020-06-20 NOTE — Progress Notes (Signed)
   06/20/20 0800  Clinical Encounter Type  Visited With Patient;Health care provider  Visit Type Spiritual support  Referral From Patient  Consult/Referral To None  Spiritual Encounters  Spiritual Needs Literature;Grief support;Emotional  Stress Factors  Patient Stress Factors Loss  Family Stress Factors None identified   Chaplain received consult from on-call PM Chaplain @ [phone] (during report) Chaplain followed up with Pt and offered emotional and spiritual support. Pt declined the visit but expressed appreciation.

## 2020-06-21 ENCOUNTER — Other Ambulatory Visit: Payer: Medicaid Other | Admitting: Obstetrics & Gynecology

## 2020-06-21 ENCOUNTER — Other Ambulatory Visit: Payer: Medicaid Other

## 2020-06-21 LAB — SURGICAL PATHOLOGY

## 2020-06-21 LAB — PATHOLOGIST SMEAR REVIEW

## 2020-06-21 NOTE — Addendum Note (Signed)
Addendum  created 06/21/20 0655 by Rachel Moulds, CRNA   Order list changed

## 2020-06-22 LAB — BPAM FFP
Blood Product Expiration Date: 202206222359
ISSUE DATE / TIME: 202206170131
Unit Type and Rh: 5100

## 2020-06-22 LAB — PREPARE FRESH FROZEN PLASMA

## 2020-06-22 MED FILL — Heparin Sodium (Porcine) Inj 1000 Unit/ML: INTRAMUSCULAR | Qty: 30 | Status: AC

## 2020-06-22 MED FILL — Sodium Chloride IV Soln 0.9%: INTRAVENOUS | Qty: 1000 | Status: AC

## 2020-06-24 ENCOUNTER — Other Ambulatory Visit: Payer: Medicaid Other | Admitting: Obstetrics & Gynecology

## 2020-06-24 ENCOUNTER — Encounter: Payer: Medicaid Other | Admitting: Obstetrics & Gynecology

## 2020-06-24 ENCOUNTER — Other Ambulatory Visit: Payer: Medicaid Other

## 2020-06-25 ENCOUNTER — Other Ambulatory Visit: Payer: Self-pay

## 2020-06-25 ENCOUNTER — Encounter: Payer: Self-pay | Admitting: Obstetrics & Gynecology

## 2020-06-25 ENCOUNTER — Ambulatory Visit (INDEPENDENT_AMBULATORY_CARE_PROVIDER_SITE_OTHER): Payer: Medicaid Other | Admitting: Obstetrics & Gynecology

## 2020-06-25 VITALS — BP 148/96 | HR 96 | Ht 66.0 in | Wt 163.0 lb

## 2020-06-25 DIAGNOSIS — Z4889 Encounter for other specified surgical aftercare: Secondary | ICD-10-CM

## 2020-06-25 NOTE — Progress Notes (Signed)
5  POSTOP VISIT Patient name: Debra Glass MRN [id number]  Date of birth: Nov 23, 1985 Chief Complaint:   Routine Post Op  History of Present Illness:   Debra Glass is a 35 y.o. [phone] female being seen today s/p primary C-section due to abruption complicated by fetal demise and DIC- 06/18/20.  Today she notes she is doing ok.  Pain well controlled. No F/C/CP/SOB.  Tolerating gen diet.  From an emotional standpoint, doing ok and feels like she has lots of good support.  Edinburgh Postnatal Depression Scale - 06/25/20 1155       Edinburgh Postnatal Depression Scale:  In the Past 7 Days   I have been able to laugh and see the funny side of things. 0    I have looked forward with enjoyment to things. 0    I have blamed myself unnecessarily when things went wrong. 0    I have been anxious or worried for no good reason. 0    I have felt scared or panicky for no good reason. 0    Things have been getting on top of me. 0    I have been so unhappy that I have had difficulty sleeping. 0    I have felt sad or miserable. 0    I have been so unhappy that I have been crying. 0    The thought of harming myself has occurred to me. 0    Edinburgh Postnatal Depression Scale Total 0              Review of Systems:   Pertinent items are noted in HPI Denies Abnormal vaginal discharge w/ itching/odor/irritation, headaches, visual changes, shortness of breath, chest pain, abdominal pain, severe nausea/vomiting, or problems with urination or bowel movements. Pertinent History Reviewed:  Reviewed past medical,surgical, obstetrical and family history.  Reviewed problem list, medications and allergies. OB History  Gravida Para Term Preterm AB Living  4 3   3 1 2   SAB IAB Ectopic Multiple Live Births  1     0 2    # Outcome Date GA Lbr Len/2nd Weight Sex Delivery Anes PTL Lv  4 Preterm 06/18/20 [redacted]w[redacted]d  3 lb 1 oz (1.389 kg) M CS-LTranv Gen  FD  3 Preterm 11/19/09 [redacted]w[redacted]d   F Vag-Spont    LIV  2 Preterm 07/08/08 [redacted]w[redacted]d   F Vag-Spont  N LIV     Complications: Premature rupture of membranes  1 SAB            Physical Assessment:   Vitals:   06/25/20 1154  BP: (!) 148/96  Pulse: 96  Weight: 163 lb (73.9 kg)  Height: 5\' 6"  (1.676 m)  Body mass index is 26.31 kg/m.       Physical Examination:   General appearance: alert, well appearing, and in no distress  Mental status: alert, oriented to person, place, and time  Skin: warm & dry   Cardiovascular: normal heart rate noted   Respiratory: normal respiratory effort, no distress   Breasts: deferred, no complaints   Abdomen: soft, non-tender   Incision: honeycomb removed- incision C/D/I  Extremities: no edema  Chaperone: N/A         No results found for this or any previous visit (from the past 24 hour(s)).  Assessment & Plan:  1wk postop C-section complicated by fetal demise/DIC -meeting op milestones appropriately -cHTN- restarted on Labetalol, encouraged pt to continue taking, track BPs and will recheck in several weeks -Briefly discussed  contraceptive management- plan for condoms/"natural planning" -Discussed future pregnancy, advised at least 25yr interval, ok for vaginal delivery in the future -Discussed close fetal observation and MFM consultation with next pregnancy if and when she is ready  Follow-up: Return in about 6 weeks (around 08/06/2020) for BP/Medication follow up.   Myna Hidalgo, DO Attending Obstetrician & Gynecologist, [facility] for [employer], [facility]

## 2020-06-28 ENCOUNTER — Other Ambulatory Visit: Payer: Medicaid Other

## 2020-06-28 ENCOUNTER — Other Ambulatory Visit: Payer: Medicaid Other | Admitting: Obstetrics & Gynecology

## 2020-07-01 ENCOUNTER — Other Ambulatory Visit: Payer: Medicaid Other | Admitting: Obstetrics & Gynecology

## 2020-07-01 ENCOUNTER — Other Ambulatory Visit: Payer: Medicaid Other

## 2020-07-01 ENCOUNTER — Encounter: Payer: Medicaid Other | Admitting: Obstetrics & Gynecology

## 2020-07-08 ENCOUNTER — Other Ambulatory Visit: Payer: Medicaid Other

## 2020-07-08 ENCOUNTER — Encounter: Payer: Medicaid Other | Admitting: Advanced Practice Midwife

## 2020-07-08 ENCOUNTER — Other Ambulatory Visit: Payer: Medicaid Other | Admitting: Women's Health

## 2020-07-12 ENCOUNTER — Other Ambulatory Visit: Payer: Medicaid Other

## 2020-07-12 ENCOUNTER — Other Ambulatory Visit: Payer: Medicaid Other | Admitting: Obstetrics & Gynecology

## 2020-07-14 DIAGNOSIS — Z029 Encounter for administrative examinations, unspecified: Secondary | ICD-10-CM

## 2020-07-15 ENCOUNTER — Other Ambulatory Visit: Payer: Medicaid Other

## 2020-07-15 ENCOUNTER — Other Ambulatory Visit: Payer: Medicaid Other | Admitting: Obstetrics & Gynecology

## 2020-07-15 ENCOUNTER — Encounter: Payer: Medicaid Other | Admitting: Obstetrics & Gynecology

## 2020-07-19 ENCOUNTER — Other Ambulatory Visit: Payer: Medicaid Other

## 2020-07-19 ENCOUNTER — Other Ambulatory Visit: Payer: Medicaid Other | Admitting: Obstetrics & Gynecology

## 2020-07-22 ENCOUNTER — Other Ambulatory Visit: Payer: Medicaid Other

## 2020-07-22 ENCOUNTER — Other Ambulatory Visit: Payer: Medicaid Other | Admitting: Obstetrics & Gynecology

## 2020-07-23 ENCOUNTER — Other Ambulatory Visit: Payer: Self-pay

## 2020-07-23 ENCOUNTER — Encounter: Payer: Self-pay | Admitting: Women's Health

## 2020-07-23 ENCOUNTER — Ambulatory Visit (INDEPENDENT_AMBULATORY_CARE_PROVIDER_SITE_OTHER): Payer: Medicaid Other | Admitting: Women's Health

## 2020-07-23 DIAGNOSIS — O165 Unspecified maternal hypertension, complicating the puerperium: Secondary | ICD-10-CM

## 2020-07-23 DIAGNOSIS — Z98891 History of uterine scar from previous surgery: Secondary | ICD-10-CM

## 2020-07-23 DIAGNOSIS — Z8759 Personal history of other complications of pregnancy, childbirth and the puerperium: Secondary | ICD-10-CM

## 2020-07-23 DIAGNOSIS — Z8751 Personal history of pre-term labor: Secondary | ICD-10-CM

## 2020-07-23 NOTE — Patient Instructions (Addendum)
Keep taking prenatal vitamins Restart Labetalol 200mg  twice daily, stop 2 days before bp check in 3wks

## 2020-07-23 NOTE — Progress Notes (Signed)
POSTPARTUM VISIT Patient name: Debra Glass MRN [id number]  Date of birth: 08/08/1985 Chief Complaint:   Postpartum Care (Bp recheck 140/98 pulse 83)  History of Present Illness:   Debra Glass is a 35 y.o. [phone] African American female being seen today for a postpartum visit. She is 5 weeks postpartum following a primary cesarean section, low transverse incision at 31.6 gestational weeks d/t IUFD, DIC remote from delivery, complete placental abruption noted. IOL: yes, for IUFD. Anesthesia: general.  Laceration: n/a.  Complications: none. Inpatient contraception: no.   Pregnancy complicated by [facility] dx @ 28wks, FGR dx @ 30wks . Tobacco use: former . Substance use disorder: no. Last pap smear: 02/13/20 and results were NILM w/ HRHPV negative. Next pap smear due: 2025 Patient's last menstrual period was 07/18/2020.  Postpartum course has been uncomplicated. Bleeding  on period . Bowel function is normal. Bladder function is normal. Urinary incontinence? no, fecal incontinence? no Patient is sexually active. Last sexual activity:  within last 2 weeks . Desired contraception:  none . Patient does want a pregnancy in the future. Understands we recommend waiting at least 35mths-68yr from delivery before trying again- but would rather go ahead and get pregnant as soon as she can.  Upstream - 07/23/20 1123       Pregnancy Intention Screening   Does the patient want to become pregnant in the next year? Yes    Does the patient's partner want to become pregnant in the next year? Yes    Would the patient like to discuss contraceptive options today? No      Contraception Wrap Up   Current Method Pregnant/Seeking Pregnancy    End Method Pregnant/Seeking Pregnancy    Contraception Counseling Provided No            The pregnancy intention screening data noted above was reviewed. Potential methods of contraception were discussed. The patient elected to proceed with Pregnant/Seeking  Pregnancy.  Edinburgh Postpartum Depression Screening: negative, coping well, has good support system  [place] Postnatal Depression Scale - 07/23/20 1125       Edinburgh Postnatal Depression Scale:  In the Past 7 Days   I have been able to laugh and see the funny side of things. 1    I have looked forward with enjoyment to things. 1    I have blamed myself unnecessarily when things went wrong. 0    I have been anxious or worried for no good reason. 0    I have felt scared or panicky for no good reason. 0    Things have been getting on top of me. 1    I have been so unhappy that I have had difficulty sleeping. 1    I have felt sad or miserable. 1    I have been so unhappy that I have been crying. 1    The thought of harming myself has occurred to me. 0    Edinburgh Postnatal Depression Scale Total 6             GAD 7 : Generalized Anxiety Score 05/26/2020 02/13/2020 12/31/2019  Nervous, Anxious, on Edge 0 0 0  Control/stop worrying 0 0 0  Worry too much - different things 0 0 0  Trouble relaxing 0 0 0  Restless 0 0 0  Easily annoyed or irritable 0 0 0  Afraid - awful might happen 0 0 0  Total GAD 7 Score 0 0 0   Review of Systems:   Pertinent  items are noted in HPI Denies Abnormal vaginal discharge w/ itching/odor/irritation, headaches, visual changes, shortness of breath, chest pain, abdominal pain, severe nausea/vomiting, or problems with urination or bowel movements. Pertinent History Reviewed:  Reviewed past medical,surgical, obstetrical and family history.  Reviewed problem list, medications and allergies. OB History  Gravida Para Term Preterm AB Living  4 3   3 1 2   SAB IAB Ectopic Multiple Live Births  1     0 2    # Outcome Date GA Lbr Len/2nd Weight Sex Delivery Anes PTL Lv  4 Preterm 06/18/20 [redacted]w[redacted]d  3 lb 1 oz (1.389 kg) M CS-LTranv Gen  FD  3 Preterm 11/19/09 [redacted]w[redacted]d   F Vag-Spont   LIV  2 Preterm 07/08/08 [redacted]w[redacted]d   F Vag-Spont  N LIV     Complications:  Premature rupture of membranes  1 SAB            Physical Assessment:   Vitals:   07/23/20 1115  BP: (!) 143/97  Pulse: 93  Weight: 156 lb 6.4 oz (70.9 kg)  Height: 5\' 6"  (1.676 m)  Body mass index is 25.24 kg/m.       Physical Examination:   General appearance: alert, well appearing, and in no distress  Mental status: alert, oriented to person, place, and time  Skin: warm & dry   Cardiovascular: normal heart rate noted   Respiratory: normal respiratory effort, no distress   Breasts: deferred, no complaints   Abdomen: soft, non-tender, c/s incision well-healed  Pelvic: examination not indicated. Thin prep pap obtained: No  Rectal: not examined  Extremities: Edema: none   Chaperone: N/A         No results found for this or any previous visit (from the past 24 hour(s)).  Assessment & Plan:  1) Postpartum exam 2) 5 wks s/p primary cesarean section, low transverse incision @ 31.wks d/t IUFD, DIC remote from delivery, complete placental abruption 3) Depression screening 4) Contraception counseling> recommended waiting 33mth-36yr before trying for another pregnancy, pt really wants to start trying as soon as possible. 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  Mood and well being:  If positive depression screen, discussed and plan developed.  If using tobacco we discussed reduction/cessation and risk of relapse If current substance abuse, we discussed and referral to local resources was offered.   2. Sexuality, contraception and birth spacing Provided guidance regarding sexuality, management of dyspareunia, and resumption of intercourse Discussed avoiding interpregnancy interval <33mths and recommended birth spacing of 18 months  3. Sleep and fatigue Discussed coping options for fatigue and sleep  disruption Encouraged family/partner/community support of 4 hrs of uninterrupted sleep to help with mood and fatigue  4. Physical recovery  If pt had a C/S, assessed incisional pain and providing guidance on normal vs prolonged recovery If pt had a laceration, perineal healing and pain reviewed.  If urinary or fecal incontinence, discussed management and referred to PT or uro/gyn if indicated  Patient is safe to resume physical activity. Discussed attainment of healthy weight.  5.  Chronic disease management Discussed pregnancy complications if any, and their implications for future childbearing and long-term maternal health. Review recommendations for prevention of recurrent pregnancy complications, such as 17 hydroxyprogesterone caproate to reduce risk for recurrent PTB yes (h/o PTB), or aspirin to  reduce risk of preeclampsia yes. Pt had GDM: no. If yes, 2hr GTT scheduled: not applicable. Referred for f/u w/ PCP or subspecialist providers as indicated: not applicable  7. Health maintenance Mammogram at 35yo or earlier if indicated Pap smears as indicated  Meds: No orders of the defined types were placed in this encounter.   Follow-up: Return in about 3 weeks (around 08/13/2020) for nurse bp check.   No orders of the defined types were placed in this encounter.   Cheral Marker CNM, [facility] 07/23/2020 1:50 PM

## 2020-07-26 ENCOUNTER — Other Ambulatory Visit: Payer: Medicaid Other

## 2020-07-26 ENCOUNTER — Other Ambulatory Visit: Payer: Medicaid Other | Admitting: Obstetrics & Gynecology

## 2020-07-28 NOTE — Discharge Summary (Signed)
Physician Discharge Summary  Patient ID: Debra Glass MRN: [id number] DOB/AGE: 1985/11/08 35 y.o.  Admit date: 06/17/2020 Discharge date: 07/28/2020  Admission Diagnoses:IUFD, placental abruption DIC  Discharge Diagnoses:  Active Problems:   History of IUFD   Discharged Condition: stable  Hospital Course: after admission the patient experienced decompensation of her clotting parameters and the decision was made to proceed with a caesarean section which went well Her clotting paramenters improved and her psot op course was unremarkable otherwise  Consults: None  Significant Diagnostic Studies: labs:   Treatments: surgery: caesarean section  Discharge Exam: Blood pressure 129/77, pulse 86, temperature 98.4 F (36.9 C), temperature source Oral, resp. rate 17, height 5\' 6"  (1.676 m), weight 80.7 kg, last menstrual period 11/08/2019, SpO2 100 %. General appearance: alert, cooperative, and no distress GI: soft, non-tender; bowel sounds normal; no masses,  no organomegaly Incision/Wound:clean dry intact  Disposition: Discharge disposition: 01-Home or Self Care       Discharge Instructions     Call MD for:  persistant nausea and vomiting   Complete by: As directed    Call MD for:  severe uncontrolled pain   Complete by: As directed    Call MD for:  temperature >100.4   Complete by: As directed    Diet - low sodium heart healthy   Complete by: As directed    Driving Restrictions   Complete by: As directed    No driving for 1 week   Increase activity slowly   Complete by: As directed    Leave dressing on - Keep it clean, dry, and intact until clinic visit   Complete by: As directed    Lifting restrictions   Complete by: As directed    Do not lift more than 10 pounds for 2 weeks   Sexual Activity Restrictions   Complete by: As directed    No sex for 6 weeks      Allergies as of 06/20/2020   No Known Allergies      Medication List     STOP taking these  medications    labetalol 200 MG tablet Commonly known as: NORMODYNE   progesterone 200 MG capsule Commonly known as: Prometrium       TAKE these medications    Blood Pressure Monitor Misc For regular home bp monitoring during pregnancy   ibuprofen 600 MG tablet Commonly known as: ADVIL Take 1 tablet (600 mg total) by mouth every 6 (six) hours as needed.   omeprazole 20 MG capsule Commonly known as: PRILOSEC Take 1 capsule (20 mg total) by mouth daily. 1 tablet a day   oxyCODONE 5 MG immediate release tablet Commonly known as: Oxy IR/ROXICODONE Take 1-2 tablets (5-10 mg total) by mouth every 4 (four) hours as needed for moderate pain.   PRENATAL PO Take by mouth.               Discharge Care Instructions  (From admission, onward)           Start     Ordered   06/20/20 0000  Leave dressing on - Keep it clean, dry, and intact until clinic visit        06/20/20 0821            Follow-up Information     [facility] Follow up on 06/25/2020.   Specialty: Obstetrics and Gynecology Why: post op visit Contact information: [address] [place] [phone]  SignedLazaro Arms 07/28/2020, 10:44 PM

## 2020-07-29 ENCOUNTER — Other Ambulatory Visit: Payer: Medicaid Other | Admitting: Obstetrics & Gynecology

## 2020-07-29 ENCOUNTER — Other Ambulatory Visit: Payer: Medicaid Other

## 2020-08-20 ENCOUNTER — Ambulatory Visit: Payer: Medicaid Other | Admitting: Obstetrics & Gynecology

## 2021-01-02 NOTE — L&D Delivery Note (Signed)
OB/GYN Faculty Practice Delivery Note  Debra Glass is a 36 y.o. [id number] s/p VBAC at [redacted]w[redacted]d. She was admitted for IOL for cHTN, TOLC, Hx of IUFD.   ROM: 2h 59m with clear fluid GBS Status:  Negative/-- (08/28 1620) Maximum Maternal Temperature:  Temp (24hrs), Avg:98.4 F (36.9 C), Min:98 F (36.7 C), Max:98.7 F (37.1 C)    Labor Progress: Patient arrived at 3 cm dilation and was induced with pitocin.   Delivery Date/Time: 09/14/2021 at 1659 Delivery: Micah Flesher to patients room because of recurrent variable decelerations. Patient was complete and started pushing. Head delivered in ROA position. No nuchal cord present. Shoulder cord and leg cord present.  Shoulder and body delivered in usual fashion. Infant with spontaneous cry, placed on mother's abdomen, dried and stimulated. Cord clamped x 2 after 1-minute delay, and cut by FOB. Cord blood drawn. Placenta delivered spontaneously with gentle cord traction. Fundus firm with massage and Pitocin. Labia, perineum, vagina, and cervix inspected with right and left labial .   Placenta: Calcified, complete, 3 vessel cord  Complications: None  Lacerations: right and left labial and right periurethral  EBL: 62 Analgesia: epidural    Infant: APGAR (1 MIN): 9   APGAR (5 MINS): 9   APGAR (10 MINS):    Weight: pending   Derrel Nip, MD  09/14/2021 5:43 PM

## 2021-01-26 ENCOUNTER — Other Ambulatory Visit: Payer: Self-pay

## 2021-01-26 ENCOUNTER — Encounter: Payer: Self-pay | Admitting: *Deleted

## 2021-01-26 ENCOUNTER — Ambulatory Visit (INDEPENDENT_AMBULATORY_CARE_PROVIDER_SITE_OTHER): Payer: Medicaid Other | Admitting: *Deleted

## 2021-01-26 VITALS — BP 141/99 | HR 98 | Ht 66.0 in | Wt 148.6 lb

## 2021-01-26 DIAGNOSIS — Z3201 Encounter for pregnancy test, result positive: Secondary | ICD-10-CM

## 2021-01-26 LAB — POCT URINE PREGNANCY: Preg Test, Ur: POSITIVE — AB

## 2021-01-26 NOTE — Progress Notes (Addendum)
° °  NURSE VISIT- PREGNANCY CONFIRMATION   SUBJECTIVE:  Debra Glass is a 36 y.o. [id number] female at [redacted]w[redacted]d by certain LMP of Patient's last menstrual period was 12/24/2020. Here for pregnancy confirmation.  Home pregnancy test: positive x 1   She reports cramping.  She is taking prenatal vitamins.    OBJECTIVE:  BP (!) 141/99 (BP Location: Right Arm, Patient Position: Sitting, Cuff Size: Normal)    Pulse 98    Ht 5\' 6"  (1.676 m)    Wt 148 lb 9.6 oz (67.4 kg)    LMP 12/24/2020    BMI 23.98 kg/m   Appears well, in no apparent distress  Results for orders placed or performed in visit on 01/26/21 (from the past 24 hour(s))  POCT urine pregnancy   Collection Time: 01/26/21 10:34 AM  Result Value Ref Range   Preg Test, Ur Positive (A) Negative    ASSESSMENT: Positive pregnancy test, [redacted]w[redacted]d by LMP    PLAN: Schedule for dating ultrasound in 4 weeks Prenatal vitamins: continue   Nausea medicines: not currently needed   OB packet given: Yes  [redacted]w[redacted]d  01/26/2021 10:41 AM  Chart reviewed for nurse visit. Agree with plan of care.  01/28/2021, DO

## 2021-02-22 ENCOUNTER — Other Ambulatory Visit: Payer: Self-pay | Admitting: Obstetrics & Gynecology

## 2021-02-22 DIAGNOSIS — O3680X0 Pregnancy with inconclusive fetal viability, not applicable or unspecified: Secondary | ICD-10-CM

## 2021-02-23 ENCOUNTER — Other Ambulatory Visit: Payer: Self-pay

## 2021-02-23 ENCOUNTER — Ambulatory Visit (INDEPENDENT_AMBULATORY_CARE_PROVIDER_SITE_OTHER): Payer: Medicaid Other

## 2021-02-23 ENCOUNTER — Encounter: Payer: Self-pay | Admitting: Obstetrics & Gynecology

## 2021-02-23 DIAGNOSIS — Z3A08 8 weeks gestation of pregnancy: Secondary | ICD-10-CM | POA: Diagnosis not present

## 2021-02-23 DIAGNOSIS — O3680X0 Pregnancy with inconclusive fetal viability, not applicable or unspecified: Secondary | ICD-10-CM

## 2021-02-23 NOTE — Progress Notes (Signed)
[place] 8+5 wks,single IUP with yolk sac,FHR 169 bpm,normal ovaries,CRL 21.71 mm

## 2021-03-17 ENCOUNTER — Encounter: Payer: Self-pay | Admitting: *Deleted

## 2021-03-17 DIAGNOSIS — O0990 Supervision of high risk pregnancy, unspecified, unspecified trimester: Secondary | ICD-10-CM | POA: Insufficient documentation

## 2021-03-18 ENCOUNTER — Other Ambulatory Visit: Payer: Self-pay

## 2021-03-18 ENCOUNTER — Ambulatory Visit: Payer: Medicaid Other | Admitting: *Deleted

## 2021-03-18 ENCOUNTER — Other Ambulatory Visit: Payer: Medicaid Other

## 2021-03-18 ENCOUNTER — Ambulatory Visit (INDEPENDENT_AMBULATORY_CARE_PROVIDER_SITE_OTHER): Payer: Medicaid Other | Admitting: Women's Health

## 2021-03-18 ENCOUNTER — Encounter: Payer: Self-pay | Admitting: Women's Health

## 2021-03-18 VITALS — BP 133/72 | HR 80 | Wt 161.0 lb

## 2021-03-18 DIAGNOSIS — Z3481 Encounter for supervision of other normal pregnancy, first trimester: Secondary | ICD-10-CM

## 2021-03-18 DIAGNOSIS — Z3A12 12 weeks gestation of pregnancy: Secondary | ICD-10-CM

## 2021-03-18 DIAGNOSIS — Z3480 Encounter for supervision of other normal pregnancy, unspecified trimester: Secondary | ICD-10-CM | POA: Diagnosis not present

## 2021-03-18 DIAGNOSIS — Z8759 Personal history of other complications of pregnancy, childbirth and the puerperium: Secondary | ICD-10-CM | POA: Diagnosis not present

## 2021-03-18 LAB — POCT URINALYSIS DIPSTICK OB
Blood, UA: NEGATIVE
Glucose, UA: NEGATIVE
Ketones, UA: NEGATIVE
Leukocytes, UA: NEGATIVE
Nitrite, UA: NEGATIVE
POC,PROTEIN,UA: NEGATIVE

## 2021-03-18 MED ORDER — ASPIRIN 81 MG PO TBEC: 162.0000 mg | DELAYED_RELEASE_TABLET | Freq: Every day | ORAL | 2 refills | Status: DC

## 2021-03-18 NOTE — Patient Instructions (Signed)
Debra Glass, thank you for choosing our office today! We appreciate the opportunity to meet your healthcare needs. You may receive a short survey by mail, e-mail, or through [employer]. If you are happy with your care we would appreciate if you could take just a few minutes to complete the survey questions. We read all of your comments and take your feedback very seriously. Thank you again for choosing our office.  ?[facility] for [employer] Team at [facility] ? [facility] at [facility] ?([address] [place], [place] [address]) ?Entrance C, located off of [address] ?Free 24/7 valet parking  ? Nausea & Vomiting ?Have saltine crackers or pretzels by your bed and eat a few bites before you raise your head out of bed in the morning ?Eat small frequent meals throughout the day instead of large meals ?Drink plenty of fluids throughout the day to stay hydrated, just don't drink a lot of fluids with your meals.  This can make your stomach fill up faster making you feel sick ?Do not brush your teeth right after you eat ?Products with real ginger are good for nausea, like ginger ale and ginger hard candy Make sure it says made with real ginger! ?Sucking on sour candy like lemon heads is also good for nausea ?If your prenatal vitamins make you nauseated, take them at night so you will sleep through the nausea ?Sea Bands ?If you feel like you need medicine for the nausea & vomiting please let us know ?If you are unable to keep any fluids or food down please let us know ? ? Constipation ?Drink plenty of fluid, preferably water, throughout the day ?Eat foods high in fiber such as fruits, vegetables, and grains ?Exercise, such as walking, is a good way to keep your bowels regular ?Drink warm fluids, especially warm prune juice, or decaf coffee ?Eat a 1/2 cup of real oatmeal (not instant), 1/2 cup applesauce, and 1/2-1 cup warm prune juice every day ?If needed, you may take Colace (docusate sodium) stool  softener once or twice a day to help keep the stool soft.  ?If you still are having problems with constipation, you may take Miralax once daily as needed to help keep your bowels regular.  ? ?Home Blood Pressure Monitoring for Patients  ? ?Your provider has recommended that you check your blood pressure (BP) at least once a week at home. If you do not have a blood pressure cuff at home, one will be provided for you. Contact your provider if you have not received your monitor within 1 week.  ? ?Helpful Tips for Accurate Home Blood Pressure Checks  ?Don't smoke, exercise, or drink caffeine 30 minutes before checking your BP ?Use the restroom before checking your BP (a full bladder can raise your pressure) ?Relax in a comfortable upright chair ?Feet on the ground ?Left arm resting comfortably on a flat surface at the level of your heart ?Legs uncrossed ?Back supported ?Sit quietly and don't talk ?Place the cuff on your bare arm ?Adjust snuggly, so that only two fingertips can fit between your skin and the top of the cuff ?Check 2 readings separated by at least one minute ?Keep a log of your BP readings ?For a visual, please reference this diagram: http://ccnc.care/bpdiagram ? ?Provider Name: [facility]     Phone: [phone] ? ?Zone 1: ALL CLEAR  ?Continue to monitor your symptoms:  ?BP reading is less than 140 (top number) or less than 90 (bottom  number)  ?No right upper stomach pain ?No headaches or seeing spots ?No feeling nauseated or throwing up ?No swelling in face and hands ? ?Zone 2: CAUTION ?Call your doctor's office for any of the following:  ?BP reading is greater than 140 (top number) or greater than 90 (bottom number)  ?Stomach pain under your ribs in the middle or right side ?Headaches or seeing spots ?Feeling nauseated or throwing up ?Swelling in face and hands ? ?Zone 3: EMERGENCY  ?Seek immediate medical care if you have any of the following:  ?BP reading is greater than160 (top number) or  greater than 110 (bottom number) ?Severe headaches not improving with Tylenol ?Serious difficulty catching your breath ?Any worsening symptoms from Zone 2  ? ? First Trimester of Pregnancy ?The first trimester of pregnancy is from week 1 until the end of week 12 (months 1 through 3). A week after a sperm fertilizes an egg, the egg will implant on the wall of the uterus. This embryo will begin to develop into a baby. Genes from you and your partner are forming the baby. The female genes determine whether the baby is a boy or a girl. At 6-8 weeks, the eyes and face are formed, and the heartbeat can be seen on ultrasound. At the end of 12 weeks, all the baby's organs are formed.  ?Now that you are pregnant, you will want to do everything you can to have a healthy baby. Two of the most important things are to get good prenatal care and to follow your health care provider's instructions. Prenatal care is all the medical care you receive before the baby's birth. This care will help prevent, find, and treat any problems during the pregnancy and childbirth. ?BODY CHANGES ?Your body goes through many changes during pregnancy. The changes vary from woman to woman.  ?You may gain or lose a couple of pounds at first. ?You may feel sick to your stomach (nauseous) and throw up (vomit). If the vomiting is uncontrollable, call your health care provider. ?You may tire easily. ?You may develop headaches that can be relieved by medicines approved by your health care provider. ?You may urinate more often. Painful urination may mean you have a bladder infection. ?You may develop heartburn as a result of your pregnancy. ?You may develop constipation because certain hormones are causing the muscles that push waste through your intestines to slow down. ?You may develop hemorrhoids or swollen, bulging veins (varicose veins). ?Your breasts may begin to grow larger and become tender. Your nipples may stick out more, and the tissue that  surrounds them (areola) may become darker. ?Your gums may bleed and may be sensitive to brushing and flossing. ?Dark spots or blotches (chloasma, mask of pregnancy) may develop on your face. This will likely fade after the baby is born. ?Your menstrual periods will stop. ?You may have a loss of appetite. ?You may develop cravings for certain kinds of food. ?You may have changes in your emotions from day to day, such as being excited to be pregnant or being concerned that something may go wrong with the pregnancy and baby. ?You may have more vivid and strange dreams. ?You may have changes in your hair. These can include thickening of your hair, rapid growth, and changes in texture. Some women also have hair loss during or after pregnancy, or hair that feels dry or thin. Your hair will most likely return to normal after your baby is born. ?WHAT TO EXPECT AT YOUR PRENATAL  VISITS ?During a routine prenatal visit: ?You will be weighed to make sure you and the baby are growing normally. ?Your blood pressure will be taken. ?Your abdomen will be measured to track your baby's growth. ?The fetal heartbeat will be listened to starting around week 10 or 12 of your pregnancy. ?Test results from any previous visits will be discussed. ?Your health care provider may ask you: ?How you are feeling. ?If you are feeling the baby move. ?If you have had any abnormal symptoms, such as leaking fluid, bleeding, severe headaches, or abdominal cramping. ?If you have any questions. ?Other tests that may be performed during your first trimester include: ?Blood tests to find your blood type and to check for the presence of any previous infections. They will also be used to check for low iron levels (anemia) and Rh antibodies. Later in the pregnancy, blood tests for diabetes will be done along with other tests if problems develop. ?Urine tests to check for infections, diabetes, or protein in the urine. ?An ultrasound to confirm the proper growth  and development of the baby. ?An amniocentesis to check for possible genetic problems. ?Fetal screens for spina bifida and Down syndrome. ?You may need other tests to make sure you and the baby are doing well. ?HOME

## 2021-03-18 NOTE — Progress Notes (Signed)
? ? ?INITIAL OBSTETRICAL VISIT ?Patient name: Debra Glass MRN [id number]  Date of birth: [birth year] ?Chief Complaint:   ?Initial Prenatal Visit ? ?History of Present Illness:   ?Debra Glass is a 36 y.o. [id number] African-American female at [redacted]w[redacted]d by LMP c/w u/s at 8 weeks with an Estimated Date of Delivery: 09/30/21 being seen today for her initial obstetrical visit.   ?Patient's last menstrual period was 12/24/2020. ?Her obstetrical history is significant for  SAB x 1, 35wk SVB d/t PTL, 36wk SVB d/t PPROM, then 31wk abruption w/ IUFD, then DIC and C/S- pregnancy complicated by [facility] and FGR  .   ?Today she reports no complaints.  ?Last pap 02/13/20. Results were: NILM w/ HRHPV negative ? ?Depression screen [facility] 2/9 03/18/2021 05/26/2020 02/13/2020 12/31/2019  ?Decreased Interest 0 0 0 0  ?Down, Depressed, Hopeless 0 0 0 0  ?PHQ - 2 Score 0 0 0 0  ?Altered sleeping 0 0 0 0  ?Tired, decreased energy 0 0 0 2  ?Change in appetite 0 0 0 1  ?Feeling bad or failure about yourself  0 0 0 0  ?Trouble concentrating 0 0 0 0  ?Moving slowly or fidgety/restless 0 0 0 0  ?Suicidal thoughts 0 0 0 0  ?PHQ-9 Score 0 0 0 3  ? ?  ?GAD 7 : Generalized Anxiety Score 03/18/2021 05/26/2020 02/13/2020 12/31/2019  ?Nervous, Anxious, on Edge 0 0 0 0  ?Control/stop worrying 0 0 0 0  ?Worry too much - different things 0 0 0 0  ?Trouble relaxing 0 0 0 0  ?Restless 0 0 0 0  ?Easily annoyed or irritable 0 0 0 0  ?Afraid - awful might happen 0 0 0 0  ?Total GAD 7 Score 0 0 0 0  ? ? ? ?Review of Systems:   ?Pertinent items are noted in HPI ?Denies cramping/contractions, leakage of fluid, vaginal bleeding, abnormal vaginal discharge w/ itching/odor/irritation, headaches, visual changes, shortness of breath, chest pain, abdominal pain, severe nausea/vomiting, or problems with urination or bowel movements unless otherwise stated above.  ?Pertinent History Reviewed:  ?Reviewed past medical,surgical, social, obstetrical and family history.  ?Reviewed  problem list, medications and allergies. ?OB History  ?Gravida Para Term Preterm AB Living  ?5 3   3 1 2   ?SAB IAB Ectopic Multiple Live Births  ?1     0 2  ?  ?# Outcome Date GA Lbr Len/2nd Weight Sex Delivery Anes PTL Lv  ?5 Current           ?4 Preterm 06/18/20 [redacted]w[redacted]d  3 lb 1 oz (1.389 kg) M CS-LTranv Gen  FD  ?3 Preterm 11/19/09 [redacted]w[redacted]d   F Vag-Spont   LIV  ?2 Preterm 07/08/08 [redacted]w[redacted]d   F Vag-Spont  N LIV  ?   Complications: Premature rupture of membranes  ?1 SAB           ? ?Physical Assessment:  ? ?Vitals:  ? 03/18/21 1025  ?BP: 133/72  ?Pulse: 80  ?Weight: 161 lb (73 kg)  ?Body mass index is 25.99 kg/m?. ? ?     Physical Examination: ? General appearance - well appearing, and in no distress ? Mental status - alert, oriented to person, place, and time ? Psych:  She has a normal mood and affect ? Skin - warm and dry, normal color, no suspicious lesions noted ? Chest - effort normal, all lung fields clear to auscultation bilaterally ? Heart - normal rate and regular rhythm ? Abdomen -  soft, nontender ? Extremities:  No swelling or varicosities noted ? Thin prep pap is not done  ? ?Chaperone: N/A   ? ?TODAY'S NT: Amber not here to do u/s, will try to get rescheduled ASAP.  ?Informal TA u/s: +FCA and active fetus ? ?Results for orders placed or performed in visit on 03/18/21 (from the past 24 hour(s))  ?POC Urinalysis Dipstick OB  ? Collection Time: 03/18/21 11:02 AM  ?Result Value Ref Range  ? Color, UA    ? Clarity, UA    ? Glucose, UA Negative Negative  ? Bilirubin, UA    ? Ketones, UA neg   ? Spec Grav, UA    ? Blood, UA neg   ? pH, UA    ? POC,PROTEIN,UA Negative Negative, Trace, Small (1+), Moderate (2+), Large (3+), 4+  ? Urobilinogen, UA    ? Nitrite, UA neg   ? Leukocytes, UA Negative Negative  ? Appearance    ? Odor    ?  ?Assessment & Plan:  ?1) Low-Risk Pregnancy [id number] at [redacted]w[redacted]d with an Estimated Date of Delivery: 09/30/21  ? ?2) Initial OB visit ? ?3) H/O PTB x 2> 35wks PTL, 36wks PPROM ? ?4) H/O 31wk  abruption w/ IUFD, DIC and C/S>pregnancy complicated by [facility] and FGR ? 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 ?6) H/O FGR ? ?Meds:  ?Meds ordered this encounter  ?Medications  ? aspirin 81 MG EC tablet  ?  Sig: Take 2 tablets (162 mg total) by mouth daily. Swallow whole.  ?  Dispense:  180 tablet  ?  Refill:  2  ?  Order Specific Question:   Supervising Provider  ?  Answer:   Duane Lope H [[id number]]  ? ? ?Initial labs obtained ?Continue prenatal vitamins ?Reviewed n/v relief measures and warning s/s to report ?Reviewed recommended weight gain based on pre-gravid BMI ?Encouraged well-balanced diet ?Genetic & carrier screening discussed: requests NT/IT, declines Panorama, already had Horizon ?Ultrasound discussed; fetal survey: requested ?CCNC completed> form faxed if has or is planning to apply for medicaid ?The nature of [facility] with multiple MDs and other Advanced Practice Providers was explained to patient; also emphasized that fellows, residents, and students are part of our team. ?Does have home bp cuff. Office bp cuff given: no. Rx sent: n/a. Check bp weekly, let us know if consistently >140/90.  ? ?Follow-up: Return for ASAP nt u/s, then 4wks for LROB w/ MD/CNM.  ? ?Orders Placed This Encounter  ?Procedures  ? Urine Culture  ? GC/Chlamydia Probe Amp  ? Protein / creatinine ratio, urine  ? Genetic Screening  ? CBC/D/Plt+RPR+Rh+ABO+RubIgG...  ? Comprehensive metabolic panel  ? POC Urinalysis Dipstick OB  ? ? ?Cheral Marker CNM, WHNP-BC ?03/18/2021 ?12:09 PM  ?

## 2021-03-19 LAB — COMPREHENSIVE METABOLIC PANEL
ALT: 10 IU/L (ref 0–32)
AST: 12 IU/L (ref 0–40)
Albumin/Globulin Ratio: 1.6 (ref 1.2–2.2)
Albumin: 4.1 g/dL (ref 3.8–4.8)
Alkaline Phosphatase: 62 IU/L (ref 44–121)
BUN/Creatinine Ratio: 11 (ref 9–23)
BUN: 7 mg/dL (ref 6–20)
Bilirubin Total: 0.3 mg/dL (ref 0.0–1.2)
CO2: 19 mmol/L — ABNORMAL LOW (ref 20–29)
Calcium: 9.1 mg/dL (ref 8.7–10.2)
Chloride: 102 mmol/L (ref 96–106)
Creatinine, Ser: 0.65 mg/dL (ref 0.57–1.00)
Globulin, Total: 2.6 g/dL (ref 1.5–4.5)
Glucose: 67 mg/dL — ABNORMAL LOW (ref 70–99)
Potassium: 4.1 mmol/L (ref 3.5–5.2)
Sodium: 135 mmol/L (ref 134–144)
Total Protein: 6.7 g/dL (ref 6.0–8.5)
eGFR: 118 mL/min/{1.73_m2} (ref >59)

## 2021-03-19 LAB — CBC/D/PLT+RPR+RH+ABO+RUBIGG...
Antibody Screen: NEGATIVE
Basophils Absolute: 0.1 10*3/uL (ref 0.0–0.2)
Basos: 1 %
EOS (ABSOLUTE): 0.1 10*3/uL (ref 0.0–0.4)
Eos: 1 %
HCV Ab: NONREACTIVE
HIV Screen 4th Generation wRfx: NONREACTIVE
Hematocrit: 43.5 % (ref 34.0–46.6)
Hemoglobin: 14.2 g/dL (ref 11.1–15.9)
Hepatitis B Surface Ag: NEGATIVE
Immature Grans (Abs): 0 10*3/uL (ref 0.0–0.1)
Immature Granulocytes: 1 %
Lymphocytes Absolute: 2.7 10*3/uL (ref 0.7–3.1)
Lymphs: 31 %
MCH: 26.5 pg — ABNORMAL LOW (ref 26.6–33.0)
MCHC: 32.6 g/dL (ref 31.5–35.7)
MCV: 81 fL (ref 79–97)
Monocytes Absolute: 0.4 10*3/uL (ref 0.1–0.9)
Monocytes: 5 %
Neutrophils Absolute: 5.3 10*3/uL (ref 1.4–7.0)
Neutrophils: 61 %
Platelets: 271 10*3/uL (ref 150–450)
RBC: 5.36 x10E6/uL — ABNORMAL HIGH (ref 3.77–5.28)
RDW: 13.7 % (ref 11.7–15.4)
RPR Ser Ql: NONREACTIVE
Rh Factor: POSITIVE
Rubella Antibodies, IGG: 2.45 index (ref >0.99)
WBC: 8.5 10*3/uL (ref 3.4–10.8)

## 2021-03-19 LAB — PROTEIN / CREATININE RATIO, URINE
Creatinine, Urine: 137.6 mg/dL
Protein, Ur: 9.8 mg/dL
Protein/Creat Ratio: 71 mg/g creat (ref 0–200)

## 2021-03-19 LAB — HCV INTERPRETATION

## 2021-03-20 LAB — GC/CHLAMYDIA PROBE AMP
Chlamydia trachomatis, NAA: NEGATIVE
Neisseria Gonorrhoeae by PCR: NEGATIVE

## 2021-03-21 ENCOUNTER — Other Ambulatory Visit: Payer: Self-pay | Admitting: Obstetrics & Gynecology

## 2021-03-21 DIAGNOSIS — Z3682 Encounter for antenatal screening for nuchal translucency: Secondary | ICD-10-CM

## 2021-03-21 LAB — URINE CULTURE

## 2021-03-22 ENCOUNTER — Other Ambulatory Visit: Payer: Medicaid Other

## 2021-03-22 ENCOUNTER — Ambulatory Visit (INDEPENDENT_AMBULATORY_CARE_PROVIDER_SITE_OTHER): Payer: Medicaid Other

## 2021-03-22 ENCOUNTER — Other Ambulatory Visit: Payer: Self-pay

## 2021-03-22 DIAGNOSIS — Z3682 Encounter for antenatal screening for nuchal translucency: Secondary | ICD-10-CM | POA: Diagnosis not present

## 2021-03-22 NOTE — Progress Notes (Signed)
[place] 12+4 wks,measurements c/w dates,CRL 67.77 mm,posterior placenta,normal ovaries,FHR 150 bpm,NT 1.6 mm ?

## 2021-03-24 LAB — INTEGRATED 1
Crown Rump Length: 67.8 mm
Gest. Age on Collection Date: 12.9 weeks
Maternal Age at EDD: 36.2 yr
Nuchal Translucency (NT): 1.6 mm
Number of Fetuses: 1
PAPP-A Value: 1503.7 ng/mL
Weight: 161 [lb_av]

## 2021-04-15 ENCOUNTER — Encounter: Payer: Self-pay | Admitting: Women's Health

## 2021-04-15 ENCOUNTER — Ambulatory Visit (INDEPENDENT_AMBULATORY_CARE_PROVIDER_SITE_OTHER): Payer: Medicaid Other | Admitting: Women's Health

## 2021-04-15 VITALS — BP 127/84 | HR 90 | Wt 164.0 lb

## 2021-04-15 DIAGNOSIS — Z8759 Personal history of other complications of pregnancy, childbirth and the puerperium: Secondary | ICD-10-CM

## 2021-04-15 DIAGNOSIS — Z1379 Encounter for other screening for genetic and chromosomal anomalies: Secondary | ICD-10-CM

## 2021-04-15 DIAGNOSIS — O0992 Supervision of high risk pregnancy, unspecified, second trimester: Secondary | ICD-10-CM

## 2021-04-15 DIAGNOSIS — Z363 Encounter for antenatal screening for malformations: Secondary | ICD-10-CM

## 2021-04-15 NOTE — Patient Instructions (Addendum)
Brun[place], thank you for choosing our office today! We appreciate the opportunity to meet your healthcare needs. You may receive a short survey by mail, e-mail, or through [employer]. If you are happy with your care we would appreciate if you could take just a few minutes to complete the survey questions. We read all of your comments and take your feedback very seriously. Thank you again for choosing our office.  ?Center for [employer] Team at [facility] ?[facility] & [place] [facility] ?([address] [place], [place] [address]) ?Entrance C, located off of [address] ?Free 24/7 valet parking  ?Go to [email or web address] to register for FREE online childbirth classes ? ?Call the office [phone]) or go to [facility] if: ?You begin to severe cramping ?Your water breaks.  Sometimes it is a big gush of fluid, sometimes it is just a trickle that keeps getting your panties wet or running down your legs ?You have vaginal bleeding.  It is normal to have a small amount of spotting if your cervix was checked.  ? ?For Headaches:  ?Stay well hydrated, drink enough water so that your urine is clear, sometimes if you are dehydrated you can get headaches ?Eat small frequent meals and snacks, sometimes if you are hungry you can get headaches ?Sometimes you get headaches during pregnancy from the pregnancy hormones ?You can try tylenol (1-2 regular strength 325mg  or 1-2 extra strength 500mg ) as directed on the box. The least amount of medication that works is best.  ?Cool compresses (cool wet washcloth or ice pack) to area of head that is hurting ?You can also try drinking a caffeinated drink to see if this will help ?If not helping, try below: ? ?For Prevention of Headaches/Migraines: ?CoQ10 100mg  three times daily ?Vitamin B2 400mg  daily ?Magnesium Oxide 400-600mg  daily ? ?Foods to alleviate migraines:  ?1) dark leafy greens ?2) avocado ?3) tuna ?4) salmon  ?5) beans and legumes ? ?Foods to avoid: ?1)  Excessive (or irregular timing) coffee ?3) aged cheeses ?4) chocolate ?5) citrus fruits ?6) aspartame and other artifical sweeteners ?7) yeast ?8) MSG (in processed foods) ?9) processed and cured meats ?10) nuts and certain seeds ?11) chicken livers and other organ meats ?12) dairy products like buttermilk, sour cream, and yogurt ?13) dried fruits like dates, figs, and raisins ?14) garlic ?15) onions ?16) potato chips ?17) pickled foods like olives and sauerkraut ?18) some fresh fruits like ripe banana, papaya, red plums, raspberries, kiwi, pineapple ?19) tomato-based products ? ?Recommend to keep a migraine diary: rate daily the severity of your headache (1-10) and what foods you eat that day to help determine patterns.  ? ?If You Get a Bad Headache/Migraine: ?Benadryl 25mg   ?Magnesium Oxide ?1 large Gatorade ?2 extra strength Tylenol (1,000mg  total) ?1 cup coffee or Coke ?   ? ? ?If this doesn't help please call us @ [phone]  ? ?[place] Pediatricians/Family Doctors ?[facility]): [address] [place], [phone]           ?[facility]: [address], [phone]                ?[facility]): [place], [phone] (call to ask if accepting patients) ?[facility] Department: [address], [place], [place]   ? ?[place] Pediatricians/Family Doctors ?[facility]): [address], [phone] ?[place]: [address] [place], [phone] ?[facility]:  [place], [phone] ? ?[place]  ?[facility]): [phone] ?[facility]: [place], [phone]  ? ?[place] ?[place]: [place] [address], [phone] ? ?[place]  ?[facility]: [phone], [phone] ? ?Home Blood Pressure Monitoring for Patients   ? ?Your provider has recommended that you check your blood pressure (BP) at least once a week at home. If you do not have a blood pressure cuff at home, one will be provided for you. Contact your provider if you have not received your monitor within 1 week.  ? ?Helpful Tips for Accurate Home Blood Pressure Checks  ?Don't smoke, exercise, or drink caffeine 30 minutes before checking your BP ?Use the restroom before checking your BP (a full bladder can raise your pressure) ?Relax in a comfortable upright chair ?Feet on the ground ?Left arm resting comfortably on a flat surface at the level of your heart ?Legs uncrossed ?Back supported ?Sit quietly and don't talk ?Place the cuff on your bare arm ?Adjust snuggly, so that only two fingertips can fit between your skin and the top of the cuff ?Check 2 readings separated by at least one minute ?Keep a log of your BP readings ?For a visual, please reference this diagram: http://ccnc.care/bpdiagram ? ?Provider Name: [facility]     Phone: [phone] ? ?Zone 1: ALL CLEAR  ?Continue to monitor your symptoms:  ?BP reading is less than 140 (top number) or less than 90 (bottom number)  ?No right upper stomach pain ?No headaches or seeing spots ?No feeling nauseated or throwing up ?No swelling in face and hands ? ?Zone 2: CAUTION ?Call your doctor's office for any of the following:  ?BP reading is greater than 140 (top number) or greater than 90 (bottom number)  ?Stomach pain under your ribs in the middle or right side ?Headaches or seeing spots ?Feeling nauseated or throwing up ?Swelling in face and hands ? ?Zone 3: EMERGENCY  ?Seek immediate medical care if you have any of the following:  ?BP reading is greater than160 (top number) or greater than 110 (bottom number) ?Severe headaches not improving with Tylenol ?Serious difficulty catching your breath ?Any worsening symptoms from Zone 2  ? ?  ?Second Trimester of Pregnancy ?The second trimester is from week 14 through  week 27 (months 4 through 6). The second trimester is often a time when you feel your best. Your body has adjusted to being pregnant, and you begin to feel better physically. Usually, morning sickness has lessened or quit completely, you may have more energy, and you may have an increase in appetite. The second trimester is also a time when the fetus is growing rapidly. At the end of the sixth month, the fetus is about 9 inches long and weighs about 1? pounds. You will likely begin to feel the baby move (quickening) between 16 and 20 weeks of pregnancy. ?Body changes during your second trimester ?Your body continues to go through many changes during your second trimester. The changes vary from woman to woman. ?Your weight will continue to increase. You will notice your lower abdomen bulging out. ?You may begin to get stretch marks on your hips, abdomen, and breasts. ?You may develop headaches that can be relieved by medicines. The medicines should be approved by your health care provider. ?You may urinate more often because the fetus is pressing on your bladder. ?You may develop or continue  to have heartburn as a result of your pregnancy. ?You may develop constipation because certain hormones are causing the muscles that push waste through your intestines to slow down. ?You may develop hemorrhoids or swollen, bulging veins (varicose veins). ?You may have back pain. This is caused by: ?Weight gain. ?Pregnancy hormones that are relaxing the joints in your pelvis. ?A shift in weight and the muscles that support your balance. ?Your breasts will continue to grow and they will continue to become tender. ?Your gums may bleed and may be sensitive to brushing and flossing. ?Dark spots or blotches (chloasma, mask of pregnancy) may develop on your face. This will likely fade after the baby is born. ?A dark line from your belly button to the pubic area (linea nigra) may appear. This will likely fade after the baby is born. ?You  may have changes in your hair. These can include thickening of your hair, rapid growth, and changes in texture. Some women also have hair loss during or after pregnancy, or hair that feels dry or thin. Your h

## 2021-04-15 NOTE — Progress Notes (Signed)
? ?HIGH-RISK PREGNANCY VISIT ?Patient name: Debra Glass MRN [id number]  Date of birth: September 22, 1985 ?Chief Complaint:   ?Routine Prenatal Visit (2nd IT today) ? ?History of Present Illness:   ?Debra Glass is a 36 y.o. [id number] female at [redacted]w[redacted]d with an Estimated Date of Delivery: 09/30/21 being seen today for ongoing management of a high-risk pregnancy complicated by history of intrauterine fetal demise.   ? ?Today she reports  some headaches, resolve w/ rest or eating. Home bp's wnl. Bought a doppler on [place], hasn't come in yet . Contractions: Not present. Vag. Bleeding: None.  Movement: Present. denies leaking of fluid.  ? ? ?  03/18/2021  ? 11:04 AM 05/26/2020  ?  9:02 AM 02/13/2020  ? 10:18 AM 12/31/2019  ? 10:14 AM  ?Depression screen PHQ 2/9  ?Decreased Interest 0 0 0 0  ?Down, Depressed, Hopeless 0 0 0 0  ?PHQ - 2 Score 0 0 0 0  ?Altered sleeping 0 0 0 0  ?Tired, decreased energy 0 0 0 2  ?Change in appetite 0 0 0 1  ?Feeling bad or failure about yourself  0 0 0 0  ?Trouble concentrating 0 0 0 0  ?Moving slowly or fidgety/restless 0 0 0 0  ?Suicidal thoughts 0 0 0 0  ?PHQ-9 Score 0 0 0 3  ? ?  ? ?  03/18/2021  ? 11:05 AM 05/26/2020  ?  9:02 AM 02/13/2020  ? 10:19 AM 12/31/2019  ? 10:14 AM  ?GAD 7 : Generalized Anxiety Score  ?Nervous, Anxious, on Edge 0 0 0 0  ?Control/stop worrying 0 0 0 0  ?Worry too much - different things 0 0 0 0  ?Trouble relaxing 0 0 0 0  ?Restless 0 0 0 0  ?Easily annoyed or irritable 0 0 0 0  ?Afraid - awful might happen 0 0 0 0  ?Total GAD 7 Score 0 0 0 0  ? ? ? ?Review of Systems:   ?Pertinent items are noted in HPI ?Denies abnormal vaginal discharge w/ itching/odor/irritation, headaches, visual changes, shortness of breath, chest pain, abdominal pain, severe nausea/vomiting, or problems with urination or bowel movements unless otherwise stated above. ?Pertinent History Reviewed:  ?Reviewed past medical,surgical, social, obstetrical and family history.  ?Reviewed problem list,  medications and allergies. ?Physical Assessment:  ? ?Vitals:  ? 04/15/21 0914  ?BP: 127/84  ?Pulse: 90  ?Weight: 164 lb (74.4 kg)  ?Body mass index is 26.47 kg/m?. ?     ?     Physical Examination:  ? General appearance: alert, well appearing, and in no distress ? Mental status: alert, oriented to person, place, and time ? Skin: warm & dry  ? Extremities: Edema: Trace  ?  Cardiovascular: normal heart rate noted ? Respiratory: normal respiratory effort, no distress ? Abdomen: gravid, soft, non-tender ? Pelvic: Cervical exam deferred        ? ?Fetal Status: Fetal Heart Rate (bpm): 146   Movement: Present   ? ?Fetal Surveillance Testing today: doppler  ? ?Chaperone: N/A   ? ?No results found for this or any previous visit (from the past 24 hour(s)).  ?Assessment & Plan:  ?High-risk pregnancy: [id number] at [redacted]w[redacted]d with an Estimated Date of Delivery: 09/30/21  ? ?1) H/O GHTN and PPHTN, ASA 162mg  daily, baseline labs wnl ? ?2) H/O 31.6wk FGR w/ GHTN, complete abruption w/ IUFD, DIC, and C/S ? ?3) H/O 98 & 36wk PTB d/t PTL, PPROM ? ?4) H/O FGR ? ?5) Prev  SVB x 2 then C/S ? ?Meds: No orders of the defined types were placed in this encounter. ? ? ?Labs/procedures today: 2nd IT ? ?Treatment Plan:  U/S q4wks      2x/wk testing @ 32wks or weekly BPP       Deliver @ 37-38wks (h/o abruption) ? ?Reviewed: Preterm labor symptoms and general obstetric precautions including but not limited to vaginal bleeding, contractions, leaking of fluid and fetal movement were reviewed in detail with the patient.  All questions were answered. Does have home bp cuff. Office bp cuff given: not applicable. Check bp weekly, let us know if consistently >140 and/or >90. ? ?Follow-up: Return in about 3 weeks (around 05/06/2021) for LROB, [id number], CNM, in person. ? ? ?Future Appointments  ?Date Time Provider [place]  ?05/06/2021 11:15 AM [facility] [place] [facility] None  ?05/06/2021 12:30 PM E[place], Mertie Clause, MD [facility]  ? ? ?Orders Placed This  Encounter  ?Procedures  ? US OB Comp + 14 Wk  ? INTEGRATED 2  ? ?[place], [place] ?04/15/2021 ?10:07 AM  ?

## 2021-04-20 LAB — INTEGRATED 2
AFP MoM: 1.1
Alpha-Fetoprotein: 38.4 ng/mL
Crown Rump Length: 67.8 mm
DIA MoM: 2.13
DIA Value: 308.7 pg/mL
Estriol, Unconjugated: 1.08 ng/mL
Gest. Age on Collection Date: 12.9 weeks
Gestational Age: 16.3 weeks
Maternal Age at EDD: 36.2 yr
Nuchal Translucency (NT): 1.6 mm
Nuchal Translucency MoM: 1.05
Number of Fetuses: 1
PAPP-A MoM: 1.41
PAPP-A Value: 1503.7 ng/mL
Test Results:: NEGATIVE
Weight: 161 [lb_av]
Weight: 164 [lb_av]
hCG MoM: 1.41
hCG Value: 47 IU/mL
uE3 MoM: 1.13

## 2021-05-06 ENCOUNTER — Ambulatory Visit (INDEPENDENT_AMBULATORY_CARE_PROVIDER_SITE_OTHER): Payer: Medicaid Other

## 2021-05-06 ENCOUNTER — Encounter: Payer: Medicaid Other | Admitting: Obstetrics & Gynecology

## 2021-05-06 ENCOUNTER — Encounter: Payer: Self-pay | Admitting: Obstetrics & Gynecology

## 2021-05-06 ENCOUNTER — Ambulatory Visit (INDEPENDENT_AMBULATORY_CARE_PROVIDER_SITE_OTHER): Payer: Medicaid Other | Admitting: Obstetrics & Gynecology

## 2021-05-06 VITALS — BP 130/83 | HR 94 | Wt 167.0 lb

## 2021-05-06 DIAGNOSIS — Z363 Encounter for antenatal screening for malformations: Secondary | ICD-10-CM | POA: Diagnosis not present

## 2021-05-06 DIAGNOSIS — Z8759 Personal history of other complications of pregnancy, childbirth and the puerperium: Secondary | ICD-10-CM | POA: Diagnosis not present

## 2021-05-06 DIAGNOSIS — O0992 Supervision of high risk pregnancy, unspecified, second trimester: Secondary | ICD-10-CM

## 2021-05-06 DIAGNOSIS — Z3A19 19 weeks gestation of pregnancy: Secondary | ICD-10-CM | POA: Diagnosis not present

## 2021-05-06 DIAGNOSIS — Z98891 History of uterine scar from previous surgery: Secondary | ICD-10-CM

## 2021-05-06 DIAGNOSIS — Z308 Encounter for other contraceptive management: Secondary | ICD-10-CM

## 2021-05-06 DIAGNOSIS — O0990 Supervision of high risk pregnancy, unspecified, unspecified trimester: Secondary | ICD-10-CM

## 2021-05-06 NOTE — Progress Notes (Signed)
[place] 19 wks,breech,posterior placenta gr 0,cx 4.1 cm,SVP of fluid 4.3 cm,FHR 144 bpm,LVEICF 1.4 mm,EFW 307 g 83%,anatomy complete ?

## 2021-05-06 NOTE — Progress Notes (Signed)
? ? ?HIGH-RISK PREGNANCY VISIT ?Patient name: Debra Glass MRN [id number]  Date of birth: 18-Feb-1985 ?Chief Complaint:   ?Routine Prenatal Visit ? ?History of Present Illness:   ?Debra Glass is a 36 y.o. [id number] female at [redacted]w[redacted]d with an Estimated Date of Delivery: 09/30/21 being seen today for ongoing management of a high-risk pregnancy complicated by history of IUFD--> FGR/GHTN/abruption/stat C section.   ? ?Today she reports no complaints. Contractions: Not present. Vag. Bleeding: None.  Movement: Present. denies leaking of fluid.  ? ? ?  03/18/2021  ? 11:04 AM 05/26/2020  ?  9:02 AM 02/13/2020  ? 10:18 AM 12/31/2019  ? 10:14 AM  ?Depression screen PHQ 2/9  ?Decreased Interest 0 0 0 0  ?Down, Depressed, Hopeless 0 0 0 0  ?PHQ - 2 Score 0 0 0 0  ?Altered sleeping 0 0 0 0  ?Tired, decreased energy 0 0 0 2  ?Change in appetite 0 0 0 1  ?Feeling bad or failure about yourself  0 0 0 0  ?Trouble concentrating 0 0 0 0  ?Moving slowly or fidgety/restless 0 0 0 0  ?Suicidal thoughts 0 0 0 0  ?PHQ-9 Score 0 0 0 3  ? ?  ? ?  03/18/2021  ? 11:05 AM 05/26/2020  ?  9:02 AM 02/13/2020  ? 10:19 AM 12/31/2019  ? 10:14 AM  ?GAD 7 : Generalized Anxiety Score  ?Nervous, Anxious, on Edge 0 0 0 0  ?Control/stop worrying 0 0 0 0  ?Worry too much - different things 0 0 0 0  ?Trouble relaxing 0 0 0 0  ?Restless 0 0 0 0  ?Easily annoyed or irritable 0 0 0 0  ?Afraid - awful might happen 0 0 0 0  ?Total GAD 7 Score 0 0 0 0  ? ? ? ?Review of Systems:   ?Pertinent items are noted in HPI ?Denies abnormal vaginal discharge w/ itching/odor/irritation, headaches, visual changes, shortness of breath, chest pain, abdominal pain, severe nausea/vomiting, or problems with urination or bowel movements unless otherwise stated above. ?Pertinent History Reviewed:  ?Reviewed past medical,surgical, social, obstetrical and family history.  ?Reviewed problem list, medications and allergies. ?Physical Assessment:  ? ?Vitals:  ? 05/06/21 1206  ?BP: 130/83   ?Pulse: 94  ?Weight: 167 lb (75.8 kg)  ?Body mass index is 26.95 kg/m?. ?     ?     Physical Examination:  ? General appearance: alert, well appearing, and in no distress ? Mental status: alert, oriented to person, place, and time ? Skin: warm & dry  ? Extremities: Edema: Trace  ?  Cardiovascular: normal heart rate noted ? Respiratory: normal respiratory effort, no distress ? Abdomen: gravid, soft, non-tender ? Pelvic: Cervical exam deferred        ? ?Fetal Status:     Movement: Present   ? ?Fetal Surveillance Testing today: sonogram  ? ?Chaperone: [place]ard [place]   ? ?No results found for this or any previous visit (from the past 24 hour(s)).  ?Assessment & Plan:  ?High-risk pregnancy: [id number] at [redacted]w[redacted]d with an Estimated Date of Delivery: 09/30/21  ? ? ?  ICD-10-CM   ?1. Encounter for supervision of high risk pregnancy in second trimester, antepartum  O09.92   ?  ?2. History of IUFD  Z87.59   ? FGR/GHTN-->complete abruption with IUFD + DIC: on 162 mg ASA  ?  ?3. History of cesarean section  Z98.891   ? wants TOLAC-->sign permit around 28 weeks  ?  ?  4. Desires tubal ligation after delivery  Z30.8   ? sign permit around 28 weeks  ?  ?  ? ? ?Meds: No orders of the defined types were placed in this encounter. ? ? ?Orders: No orders of the defined types were placed in this encounter. ?  ? ?Labs/procedures today: U/S ? ?Treatment Plan:  see above ? ?Reviewed: Preterm labor symptoms and general obstetric precautions including but not limited to vaginal bleeding, contractions, leaking of fluid and fetal movement were reviewed in detail with the patient.  All questions were answered. Does have home bp cuff. Office bp cuff given: not applicable. Check bp daily, let us know if consistently >140 and/or >90. ? ?Follow-up: Return in about 4 weeks (around 06/03/2021) for [place]. ? ? ?Future Appointments  ?Date Time Provider [place]  ?05/06/2021 12:30 PM Alvie Fowles, Mertie Clause, MD [facility]  ? ? ?No orders of the defined types were  placed in this encounter. ? ?Florian Buff  ?Attending Physician for the Center for [place] ?[facility] ?05/06/2021 ?12:29 PM ? ?

## 2021-06-03 ENCOUNTER — Ambulatory Visit (INDEPENDENT_AMBULATORY_CARE_PROVIDER_SITE_OTHER): Payer: Medicaid Other | Admitting: Obstetrics & Gynecology

## 2021-06-03 ENCOUNTER — Encounter: Payer: Self-pay | Admitting: Obstetrics & Gynecology

## 2021-06-03 VITALS — BP 141/92 | HR 101 | Wt 173.0 lb

## 2021-06-03 DIAGNOSIS — R030 Elevated blood-pressure reading, without diagnosis of hypertension: Secondary | ICD-10-CM

## 2021-06-03 DIAGNOSIS — Z3A23 23 weeks gestation of pregnancy: Secondary | ICD-10-CM

## 2021-06-03 DIAGNOSIS — O0990 Supervision of high risk pregnancy, unspecified, unspecified trimester: Secondary | ICD-10-CM

## 2021-06-03 DIAGNOSIS — O10912 Unspecified pre-existing hypertension complicating pregnancy, second trimester: Secondary | ICD-10-CM

## 2021-06-03 LAB — POCT URINALYSIS DIPSTICK OB
Blood, UA: NEGATIVE
Glucose, UA: NEGATIVE
Ketones, UA: NEGATIVE
Leukocytes, UA: NEGATIVE
Nitrite, UA: NEGATIVE
POC,PROTEIN,UA: NEGATIVE

## 2021-06-03 MED ORDER — LABETALOL HCL 100 MG PO TABS: 200.0000 mg | ORAL_TABLET | Freq: Two times a day (BID) | ORAL | 2 refills | Status: DC

## 2021-06-03 NOTE — Progress Notes (Signed)
HIGH-RISK PREGNANCY VISIT Patient name: Debra Glass MRN [id number]  Date of birth: 1985/03/16 Chief Complaint:   Routine Prenatal Visit  History of Present Illness:   Debra Glass is a 36 y.o. [id number] female at [redacted]w[redacted]d with an Estimated Date of Delivery: 09/30/21 being seen today for ongoing management of a high-risk pregnancy complicated by chronic hypertension currently on begin labetalol 100 BID today.    Today she reports no complaints. Contractions: Not present. Vag. Bleeding: None.  Movement: Present. denies leaking of fluid.      03/18/2021   11:04 AM 05/26/2020    9:02 AM 02/13/2020   10:18 AM 12/31/2019   10:14 AM  Depression screen PHQ 2/9  Decreased Interest 0 0 0 0  Down, Depressed, Hopeless 0 0 0 0  PHQ - 2 Score 0 0 0 0  Altered sleeping 0 0 0 0  Tired, decreased energy 0 0 0 2  Change in appetite 0 0 0 1  Feeling bad or failure about yourself  0 0 0 0  Trouble concentrating 0 0 0 0  Moving slowly or fidgety/restless 0 0 0 0  Suicidal thoughts 0 0 0 0  PHQ-9 Score 0 0 0 3        03/18/2021   11:05 AM 05/26/2020    9:02 AM 02/13/2020   10:19 AM 12/31/2019   10:14 AM  GAD 7 : Generalized Anxiety Score  Nervous, Anxious, on Edge 0 0 0 0  Control/stop worrying 0 0 0 0  Worry too much - different things 0 0 0 0  Trouble relaxing 0 0 0 0  Restless 0 0 0 0  Easily annoyed or irritable 0 0 0 0  Afraid - awful might happen 0 0 0 0  Total GAD 7 Score 0 0 0 0     Review of Systems:   Pertinent items are noted in HPI Denies abnormal vaginal discharge w/ itching/odor/irritation, headaches, visual changes, shortness of breath, chest pain, abdominal pain, severe nausea/vomiting, or problems with urination or bowel movements unless otherwise stated above. Pertinent History Reviewed:  Reviewed past medical,surgical, social, obstetrical and family history.  Reviewed problem list, medications and allergies. Physical Assessment:   Vitals:   06/03/21 0852  06/03/21 0854  BP: (!) 144/92 (!) 141/92  Pulse: (!) 107 (!) 101  Weight: 173 lb (78.5 kg)   Body mass index is 27.92 kg/m.           Physical Examination:   General appearance: alert, well appearing, and in no distress  Mental status: alert, oriented to person, place, and time  Skin: warm & dry   Extremities: Edema: None    Cardiovascular: normal heart rate noted  Respiratory: normal respiratory effort, no distress  Abdomen: gravid, soft, non-tender  Pelvic: Cervical exam deferred         Fetal Status: Fetal Heart Rate (bpm): 150 Fundal Height: 26 cm Movement: Present    Fetal Surveillance Testing today: FHR 150   Chaperone: N/A    Results for orders placed or performed in visit on 06/03/21 (from the past 24 hour(s))  POC Urinalysis Dipstick OB   Collection Time: 06/03/21  9:04 AM  Result Value Ref Range   Color, UA     Clarity, UA     Glucose, UA Negative Negative   Bilirubin, UA     Ketones, UA neg    Spec Grav, UA     Blood, UA neg    pH, UA  POC,PROTEIN,UA Negative Negative, Trace, Small (1+), Moderate (2+), Large (3+), 4+   Urobilinogen, UA     Nitrite, UA neg    Leukocytes, UA Negative Negative   Appearance     Odor      Assessment & Plan:  High-risk pregnancy: [id number] at [redacted]w[redacted]d with an Estimated Date of Delivery: 09/30/21      ICD-10-CM   1. Supervision of high risk pregnancy, antepartum  O09.90 POC Urinalysis Dipstick OB    2. Maternal chronic hypertension in second trimester  O10.912    start labetalol 100 BID today, continue ASA 162    3. Elevated blood-pressure reading without diagnosis of hypertension  R03.0 POC Urinalysis Dipstick OB    4. [redacted] weeks gestation of pregnancy  Z3A.23 POC Urinalysis Dipstick OB        Meds:  Meds ordered this encounter  Medications   labetalol (NORMODYNE) 100 MG tablet    Sig: Take 2 tablets (200 mg total) by mouth 2 (two) times daily.    Dispense:  60 tablet    Refill:  2    Orders:  Orders Placed This  Encounter  Procedures   POC Urinalysis Dipstick OB     Labs/procedures today: none  Treatment Plan:  begin labetalol today, sonogram for growth next visit, PN2  Reviewed: Preterm labor symptoms and general obstetric precautions including but not limited to vaginal bleeding, contractions, leaking of fluid and fetal movement were reviewed in detail with the patient.  All questions were answered. Does have home bp cuff. Office bp cuff given: not applicable. Check bp twice daily, let us know if consistently >150 and/or >95.  Follow-up: Return in about 4 weeks (around 07/01/2021) for sonogram for growth + PN2, HROB.   No future appointments.  Orders Placed This Encounter  Procedures   POC Urinalysis Dipstick OB   Florian Buff  Attending Physician for the Center for [place] [facility] 06/03/2021 9:34 AM

## 2021-07-08 ENCOUNTER — Other Ambulatory Visit: Payer: Self-pay | Admitting: Obstetrics & Gynecology

## 2021-07-08 DIAGNOSIS — Z8759 Personal history of other complications of pregnancy, childbirth and the puerperium: Secondary | ICD-10-CM

## 2021-07-08 DIAGNOSIS — O10919 Unspecified pre-existing hypertension complicating pregnancy, unspecified trimester: Secondary | ICD-10-CM

## 2021-07-11 ENCOUNTER — Other Ambulatory Visit: Payer: Medicaid Other

## 2021-07-11 ENCOUNTER — Ambulatory Visit (INDEPENDENT_AMBULATORY_CARE_PROVIDER_SITE_OTHER): Payer: Medicaid Other

## 2021-07-11 ENCOUNTER — Encounter: Payer: Self-pay | Admitting: Obstetrics & Gynecology

## 2021-07-11 ENCOUNTER — Ambulatory Visit (INDEPENDENT_AMBULATORY_CARE_PROVIDER_SITE_OTHER): Payer: Medicaid Other | Admitting: Obstetrics & Gynecology

## 2021-07-11 VITALS — BP 148/91 | HR 91 | Wt 174.0 lb

## 2021-07-11 DIAGNOSIS — Z8759 Personal history of other complications of pregnancy, childbirth and the puerperium: Secondary | ICD-10-CM | POA: Diagnosis not present

## 2021-07-11 DIAGNOSIS — O0990 Supervision of high risk pregnancy, unspecified, unspecified trimester: Secondary | ICD-10-CM

## 2021-07-11 DIAGNOSIS — O0993 Supervision of high risk pregnancy, unspecified, third trimester: Secondary | ICD-10-CM

## 2021-07-11 DIAGNOSIS — O10913 Unspecified pre-existing hypertension complicating pregnancy, third trimester: Secondary | ICD-10-CM

## 2021-07-11 DIAGNOSIS — Z131 Encounter for screening for diabetes mellitus: Secondary | ICD-10-CM

## 2021-07-11 DIAGNOSIS — Z3A28 28 weeks gestation of pregnancy: Secondary | ICD-10-CM

## 2021-07-11 DIAGNOSIS — O10919 Unspecified pre-existing hypertension complicating pregnancy, unspecified trimester: Secondary | ICD-10-CM | POA: Diagnosis not present

## 2021-07-11 LAB — POCT URINALYSIS DIPSTICK OB
Blood, UA: NEGATIVE
Ketones, UA: NEGATIVE
Leukocytes, UA: NEGATIVE
Nitrite, UA: NEGATIVE
POC,PROTEIN,UA: NEGATIVE

## 2021-07-11 NOTE — Progress Notes (Signed)
HIGH-RISK PREGNANCY VISIT Patient name: Debra Glass MRN [id number]  Date of birth: 1985/12/13 Chief Complaint:   Routine Prenatal Visit  History of Present Illness:   Debra Glass is a 36 y.o. [id number] female at [redacted]w[redacted]d with an Estimated Date of Delivery: 09/30/21 being seen today for ongoing management of a high-risk pregnancy complicated by chronic hypertension currently on CHTN on labetalol 100 BID(didn't take this am due to PN2).    Today she reports no complaints. Contractions: Not present. Vag. Bleeding: None.  Movement: Present. denies leaking of fluid.      07/11/2021    9:48 AM 03/18/2021   11:04 AM 05/26/2020    9:02 AM 02/13/2020   10:18 AM 12/31/2019   10:14 AM  Depression screen PHQ 2/9  Decreased Interest 0 0 0 0 0  Down, Depressed, Hopeless 0 0 0 0 0  PHQ - 2 Score 0 0 0 0 0  Altered sleeping 0 0 0 0 0  Tired, decreased energy 1 0 0 0 2  Change in appetite 0 0 0 0 1  Feeling bad or failure about yourself  0 0 0 0 0  Trouble concentrating 0 0 0 0 0  Moving slowly or fidgety/restless 0 0 0 0 0  Suicidal thoughts 0 0 0 0 0  PHQ-9 Score 1 0 0 0 3        07/11/2021    9:49 AM 03/18/2021   11:05 AM 05/26/2020    9:02 AM 02/13/2020   10:19 AM  GAD 7 : Generalized Anxiety Score  Nervous, Anxious, on Edge 0 0 0 0  Control/stop worrying 0 0 0 0  Worry too much - different things 0 0 0 0  Trouble relaxing 0 0 0 0  Restless 0 0 0 0  Easily annoyed or irritable 0 0 0 0  Afraid - awful might happen 0 0 0 0  Total GAD 7 Score 0 0 0 0     Review of Systems:   Pertinent items are noted in HPI Denies abnormal vaginal discharge w/ itching/odor/irritation, headaches, visual changes, shortness of breath, chest pain, abdominal pain, severe nausea/vomiting, or problems with urination or bowel movements unless otherwise stated above. Pertinent History Reviewed:  Reviewed past medical,surgical, social, obstetrical and family history.  Reviewed problem list,  medications and allergies. Physical Assessment:   Vitals:   07/11/21 1016  BP: (!) 148/91  Pulse: 91  Weight: 174 lb (78.9 kg)  Body mass index is 28.08 kg/m.           Physical Examination:   General appearance: alert, well appearing, and in no distress  Mental status: alert, oriented to person, place, and time  Skin: warm & dry   Extremities: Edema: Trace    Cardiovascular: normal heart rate noted  Respiratory: normal respiratory effort, no distress  Abdomen: gravid, soft, non-tender  Pelvic: Cervical exam deferred         Fetal Status:     Movement: Present    Fetal Surveillance Testing today: sonogram   Chaperone: N/A    Results for orders placed or performed in visit on 07/11/21 (from the past 24 hour(s))  POC Urinalysis Dipstick OB   Collection Time: 07/11/21 10:21 AM  Result Value Ref Range   Color, UA     Clarity, UA     Glucose, UA Small (1+) (A) Negative   Bilirubin, UA     Ketones, UA neg    Spec Grav, UA  Blood, UA neg    pH, UA     POC,PROTEIN,UA Negative Negative, Trace, Small (1+), Moderate (2+), Large (3+), 4+   Urobilinogen, UA     Nitrite, UA neg    Leukocytes, UA Negative Negative   Appearance     Odor      Assessment & Plan:  High-risk pregnancy: F7P1025 at [redacted]w[redacted]d with an Estimated Date of Delivery: 09/30/21      ICD-10-CM   1. Pregnancy, supervision, high-risk, third trimester  O09.93 POC Urinalysis Dipstick OB    2. Maternal chronic hypertension in third trimester  O10.913    labetalol 100 BID, well controlled    3. [redacted] weeks gestation of pregnancy  Z3A.28 POC Urinalysis Dipstick OB        Meds: No orders of the defined types were placed in this encounter.   Orders:  Orders Placed This Encounter  Procedures   POC Urinalysis Dipstick OB     Labs/procedures today: PN2 + sonogram  Treatment Plan:  routine care for CHTN, 3 weeks  Reviewed: Preterm labor symptoms and general obstetric precautions including but not limited to  vaginal bleeding, contractions, leaking of fluid and fetal movement were reviewed in detail with the patient.  All questions were answered. Does have home bp cuff. Office bp cuff given: not applicable. Check bp daily, let us know if consistently >150 and/or >95.  Follow-up: Return in about 3 weeks (around 08/01/2021) for HROB.   No future appointments.  Orders Placed This Encounter  Procedures   POC Urinalysis Dipstick OB   Lazaro Arms  Attending Physician for the Center for [facility] 07/11/2021 10:44 AM

## 2021-07-11 NOTE — Progress Notes (Signed)
[place] 28+3 wks,cephalic,posterior placenta gr 3,AFI 14 cm,LVEICF,FHR 159 bpm,cx 3.8 cm,EFW 1404 g 76%

## 2021-07-12 LAB — CBC
Hematocrit: 34.9 % (ref 34.0–46.6)
Hemoglobin: 11.9 g/dL (ref 11.1–15.9)
MCH: 27 pg (ref 26.6–33.0)
MCHC: 34.1 g/dL (ref 31.5–35.7)
MCV: 79 fL (ref 79–97)
Platelets: 214 10*3/uL (ref 150–450)
RBC: 4.4 x10E6/uL (ref 3.77–5.28)
RDW: 13.8 % (ref 11.7–15.4)
WBC: 8 10*3/uL (ref 3.4–10.8)

## 2021-07-12 LAB — RPR: RPR Ser Ql: NONREACTIVE

## 2021-07-12 LAB — ANTIBODY SCREEN: Antibody Screen: NEGATIVE

## 2021-07-12 LAB — GLUCOSE TOLERANCE, 2 HOURS W/ 1HR
Glucose, 1 hour: 130 mg/dL (ref 70–179)
Glucose, 2 hour: 92 mg/dL (ref 70–152)
Glucose, Fasting: 84 mg/dL (ref 70–91)

## 2021-07-12 LAB — HIV ANTIBODY (ROUTINE TESTING W REFLEX): HIV Screen 4th Generation wRfx: NONREACTIVE

## 2021-07-16 ENCOUNTER — Other Ambulatory Visit: Payer: Self-pay | Admitting: Obstetrics & Gynecology

## 2021-07-20 ENCOUNTER — Encounter: Payer: Self-pay | Admitting: *Deleted

## 2021-08-01 ENCOUNTER — Ambulatory Visit (INDEPENDENT_AMBULATORY_CARE_PROVIDER_SITE_OTHER): Payer: Medicaid Other | Admitting: Obstetrics & Gynecology

## 2021-08-01 ENCOUNTER — Encounter: Payer: Self-pay | Admitting: Obstetrics & Gynecology

## 2021-08-01 VITALS — BP 127/87 | HR 94 | Wt 178.0 lb

## 2021-08-01 DIAGNOSIS — O10913 Unspecified pre-existing hypertension complicating pregnancy, third trimester: Secondary | ICD-10-CM

## 2021-08-01 DIAGNOSIS — O0990 Supervision of high risk pregnancy, unspecified, unspecified trimester: Secondary | ICD-10-CM

## 2021-08-01 DIAGNOSIS — Z8759 Personal history of other complications of pregnancy, childbirth and the puerperium: Secondary | ICD-10-CM

## 2021-08-01 NOTE — Progress Notes (Signed)
HIGH-RISK PREGNANCY VISIT Patient name: Debra Glass MRN [id number]  Date of birth: [birth year] Chief Complaint:   Routine Prenatal Visit  History of Present Illness:   Debra Glass is a 36 y.o. I6N6295 female at [redacted]w[redacted]d with an Estimated Date of Delivery: 09/30/21 being seen today for ongoing management of a high-risk pregnancy complicated by chronic hypertension currently on labetalol 100 BID.    Today she reports no complaints. Contractions: Not present. Vag. Bleeding: None.  Movement: Present. denies leaking of fluid.      07/11/2021    9:48 AM 03/18/2021   11:04 AM 05/26/2020    9:02 AM 02/13/2020   10:18 AM 12/31/2019   10:14 AM  Depression screen PHQ 2/9  Decreased Interest 0 0 0 0 0  Down, Depressed, Hopeless 0 0 0 0 0  PHQ - 2 Score 0 0 0 0 0  Altered sleeping 0 0 0 0 0  Tired, decreased energy 1 0 0 0 2  Change in appetite 0 0 0 0 1  Feeling bad or failure about yourself  0 0 0 0 0  Trouble concentrating 0 0 0 0 0  Moving slowly or fidgety/restless 0 0 0 0 0  Suicidal thoughts 0 0 0 0 0  PHQ-9 Score 1 0 0 0 3        07/11/2021    9:49 AM 03/18/2021   11:05 AM 05/26/2020    9:02 AM 02/13/2020   10:19 AM  GAD 7 : Generalized Anxiety Score  Nervous, Anxious, on Edge 0 0 0 0  Control/stop worrying 0 0 0 0  Worry too much - different things 0 0 0 0  Trouble relaxing 0 0 0 0  Restless 0 0 0 0  Easily annoyed or irritable 0 0 0 0  Afraid - awful might happen 0 0 0 0  Total GAD 7 Score 0 0 0 0     Review of Systems:   Pertinent items are noted in HPI Denies abnormal vaginal discharge w/ itching/odor/irritation, headaches, visual changes, shortness of breath, chest pain, abdominal pain, severe nausea/vomiting, or problems with urination or bowel movements unless otherwise stated above. Pertinent History Reviewed:  Reviewed past medical,surgical, social, obstetrical and family history.  Reviewed problem list, medications and allergies. Physical Assessment:    Vitals:   08/01/21 0919  BP: 127/87  Pulse: 94  Weight: 178 lb (80.7 kg)  Body mass index is 28.73 kg/m.           Physical Examination:   General appearance: alert, well appearing, and in no distress  Mental status: alert, oriented to person, place, and time  Skin: warm & dry   Extremities: Edema: Trace    Cardiovascular: normal heart rate noted  Respiratory: normal respiratory effort, no distress  Abdomen: gravid, soft, non-tender  Pelvic: Cervical exam deferred         Fetal Status: Fetal Heart Rate (bpm): 132 Fundal Height: 31 cm Movement: Present    Fetal Surveillance Testing today: FHR 132   Chaperone: N/A    No results found for this or any previous visit (from the past 24 hour(s)).  Assessment & Plan:  High-risk pregnancy: [id number] at [redacted]w[redacted]d with an Estimated Date of Delivery: 09/30/21      ICD-10-CM   1. Supervision of high risk pregnancy, antepartum  O09.90     2. Maternal chronic hypertension in third trimester  O10.913     3. History of IUFD  Z71.59  Meds: No orders of the defined types were placed in this encounter.   Orders: No orders of the defined types were placed in this encounter.    Labs/procedures today: none  Treatment Plan:  twice weekly surveillance beginning thursday  Reviewed: Preterm labor symptoms and general obstetric precautions including but not limited to vaginal bleeding, contractions, leaking of fluid and fetal movement were reviewed in detail with the patient.  All questions were answered. Does have home bp cuff. Office bp cuff given: not applicable. Check bp daily, let us know if consistently >140 and/or >90.  Follow-up: Return in about 3 days (around 08/04/2021) for NST, Nurse only, every thrusday, sonogram BPP every MOnday going forward til 09/23/21+ prov visit.   No future appointments.  No orders of the defined types were placed in this encounter.  Lazaro Arms  Attending Physician for the Center for [facility] 08/01/2021 9:33 AM

## 2021-08-04 ENCOUNTER — Ambulatory Visit: Payer: Medicaid Other | Admitting: *Deleted

## 2021-08-04 VITALS — BP 135/77 | HR 96 | Wt 177.2 lb

## 2021-08-04 DIAGNOSIS — O0990 Supervision of high risk pregnancy, unspecified, unspecified trimester: Secondary | ICD-10-CM

## 2021-08-04 DIAGNOSIS — Z1389 Encounter for screening for other disorder: Secondary | ICD-10-CM

## 2021-08-04 DIAGNOSIS — Z331 Pregnant state, incidental: Secondary | ICD-10-CM

## 2021-08-04 LAB — POCT URINALYSIS DIPSTICK OB
Blood, UA: NEGATIVE
Glucose, UA: NEGATIVE
Leukocytes, UA: NEGATIVE
Nitrite, UA: NEGATIVE

## 2021-08-04 NOTE — Progress Notes (Signed)
   NURSE VISIT- NST  SUBJECTIVE:  Debra Glass is a 36 y.o. [id number] female at [redacted]w[redacted]d, here for a NST for pregnancy complicated by [facility].  She reports active fetal movement, contractions: none, vaginal bleeding: none, membranes: intact.   OBJECTIVE:  BP 135/77   Pulse 96   Wt 177 lb 3.2 oz (80.4 kg)   LMP 12/24/2020   BMI 28.60 kg/m   Appears well, no apparent distress  Results for orders placed or performed in visit on 08/04/21 (from the past 24 hour(s))  POC Urinalysis Dipstick OB   Collection Time: 08/04/21  2:59 PM  Result Value Ref Range   Color, UA     Clarity, UA     Glucose, UA Negative Negative   Bilirubin, UA     Ketones, UA moderate    Spec Grav, UA     Blood, UA neg    pH, UA     POC,PROTEIN,UA Trace Negative, Trace, Small (1+), Moderate (2+), Large (3+), 4+   Urobilinogen, UA     Nitrite, UA neg    Leukocytes, UA Negative Negative   Appearance     Odor      NST: FHR baseline 135 bpm, Variability: moderate, Accelerations:present, Decelerations:  Absent= Cat 1/reactive Toco: none   ASSESSMENT: [id number] at [redacted]w[redacted]d with GHTN NST reactive  PLAN: EFM strip reviewed by Dr. Despina Hidden   Recommendations: keep next appointment as scheduled    Annamarie Dawley  08/04/2021 3:38 PM

## 2021-08-05 ENCOUNTER — Other Ambulatory Visit: Payer: Self-pay | Admitting: Obstetrics & Gynecology

## 2021-08-05 DIAGNOSIS — Z8759 Personal history of other complications of pregnancy, childbirth and the puerperium: Secondary | ICD-10-CM

## 2021-08-05 DIAGNOSIS — O10919 Unspecified pre-existing hypertension complicating pregnancy, unspecified trimester: Secondary | ICD-10-CM

## 2021-08-08 ENCOUNTER — Encounter: Payer: Medicaid Other | Admitting: Advanced Practice Midwife

## 2021-08-08 ENCOUNTER — Other Ambulatory Visit: Payer: Medicaid Other

## 2021-08-08 ENCOUNTER — Ambulatory Visit (INDEPENDENT_AMBULATORY_CARE_PROVIDER_SITE_OTHER): Payer: Medicaid Other

## 2021-08-08 ENCOUNTER — Ambulatory Visit (INDEPENDENT_AMBULATORY_CARE_PROVIDER_SITE_OTHER): Payer: Medicaid Other | Admitting: Advanced Practice Midwife

## 2021-08-08 VITALS — BP 123/76 | HR 85 | Wt 180.0 lb

## 2021-08-08 DIAGNOSIS — O10919 Unspecified pre-existing hypertension complicating pregnancy, unspecified trimester: Secondary | ICD-10-CM | POA: Diagnosis not present

## 2021-08-08 DIAGNOSIS — O0990 Supervision of high risk pregnancy, unspecified, unspecified trimester: Secondary | ICD-10-CM

## 2021-08-08 DIAGNOSIS — O0993 Supervision of high risk pregnancy, unspecified, third trimester: Secondary | ICD-10-CM

## 2021-08-08 DIAGNOSIS — Z3A32 32 weeks gestation of pregnancy: Secondary | ICD-10-CM | POA: Diagnosis not present

## 2021-08-08 DIAGNOSIS — Z8759 Personal history of other complications of pregnancy, childbirth and the puerperium: Secondary | ICD-10-CM

## 2021-08-08 DIAGNOSIS — O10913 Unspecified pre-existing hypertension complicating pregnancy, third trimester: Secondary | ICD-10-CM | POA: Insufficient documentation

## 2021-08-08 LAB — POCT URINALYSIS DIPSTICK OB
Blood, UA: NEGATIVE
Glucose, UA: NEGATIVE
Ketones, UA: NEGATIVE
Leukocytes, UA: NEGATIVE
Nitrite, UA: NEGATIVE
POC,PROTEIN,UA: NEGATIVE

## 2021-08-08 MED ORDER — OMEPRAZOLE MAGNESIUM 20 MG PO TBEC: 20.0000 mg | DELAYED_RELEASE_TABLET | Freq: Two times a day (BID) | ORAL | 1 refills | Status: DC

## 2021-08-08 NOTE — Patient Instructions (Signed)
Debra Glass, I greatly value your feedback.  If you receive a survey following your visit with [place] today, we appreciate you taking the time to fill it out.  Thanks, Cathie Beams, DNP, CNM  [facility] HAS MOVED!!! It is now [facility] ([address] [place], [place] [address]) Entrance located off of [address] Free 24/7 valet parking   Go to [email or web address] to register for FREE online childbirth classes    Call the office [phone]) or go to [facility] if: You begin to have strong, frequent contractions Your water breaks.  Sometimes it is a big gush of fluid, sometimes it is just a trickle that keeps getting your panties wet or running down your legs You have vaginal bleeding.  It is normal to have a small amount of spotting if your cervix was checked.  You don't feel your baby moving like normal.  If you don't, get you something to eat and drink and lay down and focus on feeling your baby move.  You should feel at least 10 movements in 2 hours.  If you don't, you should call the office or go to [facility].   Home Blood Pressure Monitoring for Patients   Your provider has recommended that you check your blood pressure (BP) at least once a week at home. If you do not have a blood pressure cuff at home, one will be provided for you. Contact your provider if you have not received your monitor within 1 week.   Helpful Tips for Accurate Home Blood Pressure Checks  Don't smoke, exercise, or drink caffeine 30 minutes before checking your BP Use the restroom before checking your BP (a full bladder can raise your pressure) Relax in a comfortable upright chair Feet on the ground Left arm resting comfortably on a flat surface at the level of your heart Legs uncrossed Back supported Sit quietly and don't talk Place the cuff on your bare arm Adjust snuggly, so that only two fingertips can fit between your skin and the  top of the cuff Check 2 readings separated by at least one minute Keep a log of your BP readings For a visual, please reference this diagram: http://ccnc.care/bpdiagram  Provider Name: [facility]     Phone: [phone]  Zone 1: ALL CLEAR  Continue to monitor your symptoms:  BP reading is less than 140 (top number) or less than 90 (bottom number)  No right upper stomach pain No headaches or seeing spots No feeling nauseated or throwing up No swelling in face and hands  Zone 2: CAUTION Call your doctor's office for any of the following:  BP reading is greater than 140 (top number) or greater than 90 (bottom number)  Stomach pain under your ribs in the middle or right side Headaches or seeing spots Feeling nauseated or throwing up Swelling in face and hands  Zone 3: EMERGENCY  Seek immediate medical care if you have any of the following:  BP reading is greater than160 (top number) or greater than 110 (bottom number) Severe headaches not improving with Tylenol Serious difficulty catching your breath Any worsening symptoms from Zone 2

## 2021-08-08 NOTE — Progress Notes (Signed)
HIGH-RISK PREGNANCY VISIT Patient name: Debra Glass MRN [id number]  Date of birth: 01/28/1985 Chief Complaint:   Routine Prenatal Visit  History of Present Illness:   Debra Glass is a 36 y.o. [id number] female at [redacted]w[redacted]d with an Estimated Date of Delivery: 09/30/21 being seen today for ongoing management of a high-risk pregnancy complicated by chronic hypertension currently on labetalol 100mg  BID/ASA 162mg .    Today she reports heartburn and reflux. Contractions: Not present. Vag. Bleeding: None.  Movement: Present. denies leaking of fluid.      07/11/2021    9:48 AM 03/18/2021   11:04 AM 05/26/2020    9:02 AM 02/13/2020   10:18 AM 12/31/2019   10:14 AM  Depression screen PHQ 2/9  Decreased Interest 0 0 0 0 0  Down, Depressed, Hopeless 0 0 0 0 0  PHQ - 2 Score 0 0 0 0 0  Altered sleeping 0 0 0 0 0  Tired, decreased energy 1 0 0 0 2  Change in appetite 0 0 0 0 1  Feeling bad or failure about yourself  0 0 0 0 0  Trouble concentrating 0 0 0 0 0  Moving slowly or fidgety/restless 0 0 0 0 0  Suicidal thoughts 0 0 0 0 0  PHQ-9 Score 1 0 0 0 3        07/11/2021    9:49 AM 03/18/2021   11:05 AM 05/26/2020    9:02 AM 02/13/2020   10:19 AM  GAD 7 : Generalized Anxiety Score  Nervous, Anxious, on Edge 0 0 0 0  Control/stop worrying 0 0 0 0  Worry too much - different things 0 0 0 0  Trouble relaxing 0 0 0 0  Restless 0 0 0 0  Easily annoyed or irritable 0 0 0 0  Afraid - awful might happen 0 0 0 0  Total GAD 7 Score 0 0 0 0     Review of Systems:   Pertinent items are noted in HPI Denies abnormal vaginal discharge w/ itching/odor/irritation, headaches, visual changes, shortness of breath, chest pain, abdominal pain, severe nausea/vomiting, or problems with urination or bowel movements unless otherwise stated above. Pertinent History Reviewed:  Reviewed past medical,surgical, social, obstetrical and family history.  Reviewed problem list, medications and  allergies. Physical Assessment:   Vitals:   08/08/21 1541  BP: 123/76  Pulse: 85  Weight: 180 lb (81.6 kg)  Body mass index is 29.05 kg/m.           Physical Examination:   General appearance: alert, well appearing, and in no distress  Mental status: alert, oriented to person, place, and time  Skin: warm & dry   Extremities: Edema: None    Cardiovascular: normal heart rate noted  Respiratory: normal respiratory effort, no distress  Abdomen: gravid, soft, non-tender  Pelvic: Cervical exam deferred         Fetal Status:     Movement: Present    Fetal Surveillance Testing today: 04/12/2020 32+3 wks,cephalic,BPP 8/8,posterior placenta gr 3,FHR 138 bpm,AFI 14 cm,LVEICF,RI .48,.49,.52,.51=8%,EFW 2086 g 56%   Chaperone: N/A    Results for orders placed or performed in visit on 08/08/21 (from the past 24 hour(s))  POC Urinalysis Dipstick OB   Collection Time: 08/08/21  3:43 PM  Result Value Ref Range   Color, UA     Clarity, UA     Glucose, UA Negative Negative   Bilirubin, UA     Ketones, UA neg  Spec Grav, UA     Blood, UA neg    pH, UA     POC,PROTEIN,UA Negative Negative, Trace, Small (1+), Moderate (2+), Large (3+), 4+   Urobilinogen, UA     Nitrite, UA neg    Leukocytes, UA Negative Negative   Appearance     Odor      Assessment & Plan:  High-risk pregnancy: [id number] at [redacted]w[redacted]d with an Estimated Date of Delivery: 09/30/21      ICD-10-CM   1. [redacted] weeks gestation of pregnancy  Z3A.32 POC Urinalysis Dipstick OB    2. Supervision of high-risk pregnancy, third trimester  O09.93 POC Urinalysis Dipstick OB    3. Maternal chronic hypertension in third trimester  O10.913    Started on labetalol at 23 weeks.  ASA 162mg  [x]       Meds: labetalol 100mg  BID  EFW q 4 weeks; 2x/wk testing nst/sono @ 32wks     Deliver 37-39wk        Meds:  Meds ordered this encounter  Medications   omeprazole (PRILOSEC OTC) 20 MG tablet    Sig: Take 1 tablet (20 mg total) by mouth 2 (two) times  daily.    Dispense:  90 tablet    Refill:  1    Order Specific Question:   Supervising Provider    Answer:   [[id number]]    Orders:  Orders Placed This Encounter  Procedures   POC Urinalysis Dipstick OB     Labs/procedures today: BPP, dopplers   Reviewed: Preterm labor symptoms and general obstetric precautions including but not limited to vaginal bleeding, contractions, leaking of fluid and fetal movement were reviewed in detail with the patient.  All questions were answered. Does have home bp cuff. Office bp cuff given: not applicable. Check bp daily, let know if consistently >140 and/or >90.  Follow-up: Return for As scheduled.   Future Appointments  Date Time Provider Department Center  08/08/2021  4:10 PM Lazaro Arms, CNM [facility]  08/11/2021 10:10 AM [facility] NURSE [facility]  08/15/2021 10:50 AM [facility] NURSE [facility]  08/15/2021 11:10 AM 10/11/2021, CNM [facility]  08/18/2021 10:30 AM [facility] NURSE [facility]  08/22/2021 11:30 AM [facility] Arabella Merles [facility] None  08/22/2021  1:30 PM 08/24/2021, CNM [facility]  08/25/2021  9:50 AM [facility] NURSE [facility]  08/29/2021 11:30 AM [facility] Cheral Marker [facility] None  08/29/2021  1:30 PM 08/31/2021, DO [facility]  09/01/2021 10:30 AM [facility] NURSE [facility]  09/08/2021 10:30 AM [facility] NURSE [facility]  09/12/2021  9:15 AM [facility] 09/03/2021 [facility] None  09/12/2021 10:30 AM 11/12/2021, DO [facility]  09/15/2021 10:50 AM [facility] NURSE [facility]  09/19/2021  8:30 AM [facility] Myna Hidalgo [facility] None  09/19/2021  9:30 AM 09/21/2021, CNM [facility]  09/22/2021 10:50 AM [facility] NURSE [facility]    Orders Placed This Encounter  Procedures   POC Urinalysis Dipstick OB   09/21/2021 , DNP, CNM [facility] 08/08/2021 3:54 PM

## 2021-08-08 NOTE — Progress Notes (Signed)
[place] 32+3 wks,cephalic,BPP 8/8,posterior placenta gr 3,FHR 138 bpm,AFI 14 cm,LVEICF,RI .48,.49,.52,.51=8%,EFW 2086 g 56%

## 2021-08-11 ENCOUNTER — Ambulatory Visit (INDEPENDENT_AMBULATORY_CARE_PROVIDER_SITE_OTHER): Payer: Medicaid Other | Admitting: *Deleted

## 2021-08-11 VITALS — BP 126/81 | HR 93 | Wt 178.0 lb

## 2021-08-11 DIAGNOSIS — O0990 Supervision of high risk pregnancy, unspecified, unspecified trimester: Secondary | ICD-10-CM | POA: Diagnosis not present

## 2021-08-11 DIAGNOSIS — Z3A32 32 weeks gestation of pregnancy: Secondary | ICD-10-CM

## 2021-08-11 DIAGNOSIS — O10913 Unspecified pre-existing hypertension complicating pregnancy, third trimester: Secondary | ICD-10-CM | POA: Diagnosis not present

## 2021-08-11 NOTE — Progress Notes (Signed)
   NURSE VISIT- NST  SUBJECTIVE:  Debra Glass is a 35 y.o. [id number] female at [redacted]w[redacted]d, here for a NST for pregnancy complicated by [facility].  She reports active fetal movement, contractions: none, vaginal bleeding: none, membranes: intact.   OBJECTIVE:  BP 126/81   Pulse 93   Wt 178 lb (80.7 kg)   LMP 12/24/2020   BMI 28.73 kg/m   Appears well, no apparent distress  No results found for this or any previous visit (from the past 24 hour(s)).  NST: FHR baseline 135 bpm, Variability: moderate, Accelerations:present, Decelerations:  Absent= Cat 1/reactive Toco: none   ASSESSMENT: [id number] at [redacted]w[redacted]d with CHTN NST reactive  PLAN: EFM strip reviewed by Dr. Charlotta Newton   Recommendations: keep next appointment as scheduled    [name]  08/11/2021 12:24 PM

## 2021-08-12 ENCOUNTER — Other Ambulatory Visit: Payer: Self-pay | Admitting: Obstetrics & Gynecology

## 2021-08-12 DIAGNOSIS — O10919 Unspecified pre-existing hypertension complicating pregnancy, unspecified trimester: Secondary | ICD-10-CM

## 2021-08-15 ENCOUNTER — Ambulatory Visit (INDEPENDENT_AMBULATORY_CARE_PROVIDER_SITE_OTHER): Payer: Medicaid Other | Admitting: Advanced Practice Midwife

## 2021-08-15 ENCOUNTER — Ambulatory Visit (INDEPENDENT_AMBULATORY_CARE_PROVIDER_SITE_OTHER): Payer: Medicaid Other

## 2021-08-15 ENCOUNTER — Encounter: Payer: Medicaid Other | Admitting: Advanced Practice Midwife

## 2021-08-15 ENCOUNTER — Other Ambulatory Visit: Payer: Medicaid Other

## 2021-08-15 VITALS — BP 124/77 | HR 90 | Wt 178.2 lb

## 2021-08-15 DIAGNOSIS — O0990 Supervision of high risk pregnancy, unspecified, unspecified trimester: Secondary | ICD-10-CM

## 2021-08-15 DIAGNOSIS — Z3A33 33 weeks gestation of pregnancy: Secondary | ICD-10-CM

## 2021-08-15 DIAGNOSIS — Z98891 History of uterine scar from previous surgery: Secondary | ICD-10-CM

## 2021-08-15 DIAGNOSIS — O10913 Unspecified pre-existing hypertension complicating pregnancy, third trimester: Secondary | ICD-10-CM

## 2021-08-15 DIAGNOSIS — O10919 Unspecified pre-existing hypertension complicating pregnancy, unspecified trimester: Secondary | ICD-10-CM | POA: Diagnosis not present

## 2021-08-15 LAB — POCT URINALYSIS DIPSTICK OB
Blood, UA: NEGATIVE
Glucose, UA: NEGATIVE
Ketones, UA: NEGATIVE
Leukocytes, UA: NEGATIVE
Nitrite, UA: NEGATIVE
POC,PROTEIN,UA: NEGATIVE

## 2021-08-15 NOTE — Patient Instructions (Signed)
[place], thank you for choosing our office today! We appreciate the opportunity to meet your healthcare needs. You may receive a short survey by mail, e-mail, or through [employer]. If you are happy with your care we would appreciate if you could take just a few minutes to complete the survey questions. We read all of your comments and take your feedback very seriously. Thank you again for choosing our office.  Center for [employer] Team at [facility]  [facility] at [facility] ([address] [place], [place] [address]) Entrance C, located off of [address] Free 24/7 valet parking   CLASSES: Go to [email or web address] to register for classes (childbirth, breastfeeding, waterbirth, infant CPR, daddy bootcamp, etc.)  Call the office [phone]) or go to [facility] if: You begin to have strong, frequent contractions Your water breaks.  Sometimes it is a big gush of fluid, sometimes it is just a trickle that keeps getting your panties wet or running down your legs You have vaginal bleeding.  It is normal to have a small amount of spotting if your cervix was checked.  You don't feel your baby moving like normal.  If you don't, get you something to eat and drink and lay down and focus on feeling your baby move.   If your baby is still not moving like normal, you should call the office or go to [facility].  Call the office [phone]) or go to [facility] hospital for these signs of pre-eclampsia: Severe headache that does not go away with Tylenol Visual changes- seeing spots, double, blurred vision Pain under your right breast or upper abdomen that does not go away with Tums or heartburn medicine Nausea and/or vomiting Severe swelling in your hands, feet, and face   Tdap Vaccine It is recommended that you get the Tdap vaccine during the third trimester of EACH pregnancy to help protect your baby from getting pertussis (whooping cough) 27-36 weeks is the BEST time to  do this so that you can pass the protection on to your baby. During pregnancy is better than after pregnancy, but if you are unable to get it during pregnancy it will be offered at the hospital.  You can get this vaccine with [place], at the health department, your family doctor, or some local pharmacies Everyone who will be around your baby should also be up-to-date on their vaccines before the baby comes. Adults (who are not pregnant) only need 1 dose of Tdap during adulthood.   [facility] Pediatricians/Family Doctors [facility]): [address] [place], [phone]           [facility]: [address], [phone]                [facility] Medicine [facility]): [address], [phone] (call to ask if accepting patients) [facility]: [address], [place], [phone]    [facility] Pediatricians/Family Doctors [facility]): [phone] [address], [phone] [facility]: [address] [place], [phone] [facility] [place]: [address], [phone]  [facility] Doctors  [facility]): [phone] [facility]: [address] [phone]   [facility] Doctors [facility]: [address] [phone]  [facility] Family Doctors  [facility]: [phone], [phone]  Home Blood Pressure Monitoring for Patients   Your provider has recommended that you check your  blood pressure (BP) at least once a week at home. If you do not have a blood pressure cuff at home, one will be provided for you. Contact your provider if you have not received your monitor within 1 week.   Helpful Tips for Accurate Home Blood Pressure Checks  Don't smoke, exercise, or drink caffeine 30 minutes before checking your BP Use the restroom before checking your BP (a full bladder can raise your  pressure) Relax in a comfortable upright chair Feet on the ground Left arm resting comfortably on a flat surface at the level of your heart Legs uncrossed Back supported Sit quietly and don't talk Place the cuff on your bare arm Adjust snuggly, so that only two fingertips can fit between your skin and the top of the cuff Check 2 readings separated by at least one minute Keep a log of your BP readings For a visual, please reference this diagram: http://ccnc.care/bpdiagram  Provider Name: [facility]     Phone: [phone]  Zone 1: ALL CLEAR  Continue to monitor your symptoms:  BP reading is less than 140 (top number) or less than 90 (bottom number)  No right upper stomach pain No headaches or seeing spots No feeling nauseated or throwing up No swelling in face and hands  Zone 2: CAUTION Call your doctor's office for any of the following:  BP reading is greater than 140 (top number) or greater than 90 (bottom number)  Stomach pain under your ribs in the middle or right side Headaches or seeing spots Feeling nauseated or throwing up Swelling in face and hands  Zone 3: EMERGENCY  Seek immediate medical care if you have any of the following:  BP reading is greater than160 (top number) or greater than 110 (bottom number) Severe headaches not improving with Tylenol Serious difficulty catching your breath Any worsening symptoms from Zone 2   Third Trimester of Pregnancy The third trimester is from week 29 through week 42, months 7 through 9. The third trimester is a time when the fetus is growing rapidly. At the end of the ninth month, the fetus is about 20 inches in length and weighs 6-10 pounds.  BODY CHANGES Your body goes through many changes during pregnancy. The changes vary from woman to woman.  Your weight will continue to increase. You can expect to gain 25-35 pounds (11-16 kg) by the end of the pregnancy. You may begin to get stretch marks on your hips, abdomen,  and breasts. You may urinate more often because the fetus is moving lower into your pelvis and pressing on your bladder. You may develop or continue to have heartburn as a result of your pregnancy. You may develop constipation because certain hormones are causing the muscles that push waste through your intestines to slow down. You may develop hemorrhoids or swollen, bulging veins (varicose veins). You may have pelvic pain because of the weight gain and pregnancy hormones relaxing your joints between the bones in your pelvis. Backaches may result from overexertion of the muscles supporting your posture. You may have changes in your hair. These can include thickening of your hair, rapid growth, and changes in texture. Some women also have hair loss during or after pregnancy, or hair that feels dry or thin. Your hair will most likely return to normal after your baby is born. Your breasts will continue to grow and be tender. A yellow discharge may leak from your breasts called colostrum. Your belly button may stick out. You may   feel short of breath because of your expanding uterus. You may notice the fetus "dropping," or moving lower in your abdomen. You may have a bloody mucus discharge. This usually occurs a few days to a week before labor begins. Your cervix becomes thin and soft (effaced) near your due date. WHAT TO EXPECT AT YOUR PRENATAL EXAMS  You will have prenatal exams every 2 weeks until week 36. Then, you will have weekly prenatal exams. During a routine prenatal visit: You will be weighed to make sure you and the fetus are growing normally. Your blood pressure is taken. Your abdomen will be measured to track your baby's growth. The fetal heartbeat will be listened to. Any test results from the previous visit will be discussed. You may have a cervical check near your due date to see if you have effaced. At around 36 weeks, your caregiver will check your cervix. At the same time, your  caregiver will also perform a test on the secretions of the vaginal tissue. This test is to determine if a type of bacteria, Group B streptococcus, is present. Your caregiver will explain this further. Your caregiver may ask you: What your birth plan is. How you are feeling. If you are feeling the baby move. If you have had any abnormal symptoms, such as leaking fluid, bleeding, severe headaches, or abdominal cramping. If you have any questions. Other tests or screenings that may be performed during your third trimester include: Blood tests that check for low iron levels (anemia). Fetal testing to check the health, activity level, and growth of the fetus. Testing is done if you have certain medical conditions or if there are problems during the pregnancy. FALSE LABOR You may feel small, irregular contractions that eventually go away. These are called Braxton Hicks contractions, or false labor. Contractions may last for hours, days, or even weeks before true labor sets in. If contractions come at regular intervals, intensify, or become painful, it is best to be seen by your caregiver.  SIGNS OF LABOR  Menstrual-like cramps. Contractions that are 5 minutes apart or less. Contractions that start on the top of the uterus and spread down to the lower abdomen and back. A sense of increased pelvic pressure or back pain. A watery or bloody mucus discharge that comes from the vagina. If you have any of these signs before the 37th week of pregnancy, call your caregiver right away. You need to go to the hospital to get checked immediately. HOME CARE INSTRUCTIONS  Avoid all smoking, herbs, alcohol, and unprescribed drugs. These chemicals affect the formation and growth of the baby. Follow your caregiver's instructions regarding medicine use. There are medicines that are either safe or unsafe to take during pregnancy. Exercise only as directed by your caregiver. Experiencing uterine cramps is a good sign to  stop exercising. Continue to eat regular, healthy meals. Wear a good support bra for breast tenderness. Do not use hot tubs, steam rooms, or saunas. Wear your seat belt at all times when driving. Avoid raw meat, uncooked cheese, cat litter boxes, and soil used by cats. These carry germs that can cause birth defects in the baby. Take your prenatal vitamins. Try taking a stool softener (if your caregiver approves) if you develop constipation. Eat more high-fiber foods, such as fresh vegetables or fruit and whole grains. Drink plenty of fluids to keep your urine clear or pale yellow. Take warm sitz baths to soothe any pain or discomfort caused by hemorrhoids. Use hemorrhoid cream if   your caregiver approves. If you develop varicose veins, wear support hose. Elevate your feet for 15 minutes, 3-4 times a day. Limit salt in your diet. Avoid heavy lifting, wear low heal shoes, and practice good posture. Rest a lot with your legs elevated if you have leg cramps or low back pain. Visit your dentist if you have not gone during your pregnancy. Use a soft toothbrush to brush your teeth and be gentle when you floss. A sexual relationship may be continued unless your caregiver directs you otherwise. Do not travel far distances unless it is absolutely necessary and only with the approval of your caregiver. Take prenatal classes to understand, practice, and ask questions about the labor and delivery. Make a trial run to the hospital. Pack your hospital bag. Prepare the baby's nursery. Continue to go to all your prenatal visits as directed by your caregiver. SEEK MEDICAL CARE IF: You are unsure if you are in labor or if your water has broken. You have dizziness. You have mild pelvic cramps, pelvic pressure, or nagging pain in your abdominal area. You have persistent nausea, vomiting, or diarrhea. You have a bad smelling vaginal discharge. You have pain with urination. SEEK IMMEDIATE MEDICAL CARE IF:  You  have a fever. You are leaking fluid from your vagina. You have spotting or bleeding from your vagina. You have severe abdominal cramping or pain. You have rapid weight loss or gain. You have shortness of breath with chest pain. You notice sudden or extreme swelling of your face, hands, ankles, feet, or legs. You have not felt your baby move in over an hour. You have severe headaches that do not go away with medicine. You have vision changes. Document Released: 12/13/2000 Document Revised: 12/24/2012 Document Reviewed: 02/20/2012 [facility] Patient Information 2015 [place], [place]. This information is not intended to replace advice given to you by your health care provider. Make sure you discuss any questions you have with your health care provider.

## 2021-08-15 NOTE — Progress Notes (Signed)
HIGH-RISK PREGNANCY VISIT Patient name: Debra Glass MRN [id number]  Date of birth: [birth year] Chief Complaint:   Routine Prenatal Visit ([place] today)  History of Present Illness:   Debra Glass is a 37 y.o. [id number] female at [redacted]w[redacted]d with an Estimated Date of Delivery: 09/30/21 being seen today for ongoing management of a high-risk pregnancy complicated by chronic hypertension currently on Lab 200mg  bid.    Today she reports no complaints. Contractions: Not present. Vag. Bleeding: None.  Movement: Present. denies leaking of fluid.      07/11/2021    9:48 AM 03/18/2021   11:04 AM 05/26/2020    9:02 AM 02/13/2020   10:18 AM 12/31/2019   10:14 AM  Depression screen PHQ 2/9  Decreased Interest 0 0 0 0 0  Down, Depressed, Hopeless 0 0 0 0 0  PHQ - 2 Score 0 0 0 0 0  Altered sleeping 0 0 0 0 0  Tired, decreased energy 1 0 0 0 2  Change in appetite 0 0 0 0 1  Feeling bad or failure about yourself  0 0 0 0 0  Trouble concentrating 0 0 0 0 0  Moving slowly or fidgety/restless 0 0 0 0 0  Suicidal thoughts 0 0 0 0 0  PHQ-9 Score 1 0 0 0 3        07/11/2021    9:49 AM 03/18/2021   11:05 AM 05/26/2020    9:02 AM 02/13/2020   10:19 AM  GAD 7 : Generalized Anxiety Score  Nervous, Anxious, on Edge 0 0 0 0  Control/stop worrying 0 0 0 0  Worry too much - different things 0 0 0 0  Trouble relaxing 0 0 0 0  Restless 0 0 0 0  Easily annoyed or irritable 0 0 0 0  Afraid - awful might happen 0 0 0 0  Total GAD 7 Score 0 0 0 0     Review of Systems:   Pertinent items are noted in HPI Denies abnormal vaginal discharge w/ itching/odor/irritation, headaches, visual changes, shortness of breath, chest pain, abdominal pain, severe nausea/vomiting, or problems with urination or bowel movements unless otherwise stated above. Pertinent History Reviewed:  Reviewed past medical,surgical, social, obstetrical and family history.  Reviewed problem list, medications and allergies. Physical  Assessment:   Vitals:   08/15/21 1550  BP: 124/77  Pulse: 90  Weight: 178 lb 3.2 oz (80.8 kg)  Body mass index is 28.76 kg/m.           Physical Examination:   General appearance: alert, well appearing, and in no distress  Mental status: alert, oriented to person, place, and time  Skin: warm & dry   Extremities: Edema: None    Cardiovascular: normal heart rate noted  Respiratory: normal respiratory effort, no distress  Abdomen: gravid, soft, non-tender  Pelvic: Cervical exam deferred         Fetal Status: Fetal Heart Rate (bpm): 137 u/s   Movement: Present    Fetal Surveillance Testing today: 08/17/21 33+3 wks,cephalic,BPP 8/8,FHR 137 bpm,AFI 11.5 cm,posterior placenta gr 3,RI .56,.59,.56=36%    Results for orders placed or performed in visit on 08/15/21 (from the past 24 hour(s))  POC Urinalysis Dipstick OB   Collection Time: 08/15/21  3:54 PM  Result Value Ref Range   Color, UA     Clarity, UA     Glucose, UA Negative Negative   Bilirubin, UA     Ketones, UA neg  Spec Grav, UA     Blood, UA neg    pH, UA     POC,PROTEIN,UA Negative Negative, Trace, Small (1+), Moderate (2+), Large (3+), 4+   Urobilinogen, UA     Nitrite, UA neg    Leukocytes, UA Negative Negative   Appearance     Odor      Assessment & Plan:  High-risk pregnancy: [id number] at [redacted]w[redacted]d with an Estimated Date of Delivery: 09/30/21   1) cHTN, stable on Lab 200mg  bid; having 2x/wk testing; IOL 37-39wks  2) Hx C/S for abruption/DIC, plans TOLAC, will sign form w Dr at appt 08/29/21  Meds: No orders of the defined types were placed in this encounter.   Labs/procedures today: U/S  Treatment Plan:  2x/wk testing; IOL 37-39wk  Reviewed: Preterm labor symptoms and general obstetric precautions including but not limited to vaginal bleeding, contractions, leaking of fluid and fetal movement were reviewed in detail with the patient.  All questions were answered. Does have home bp cuff. Office bp cuff given:  not applicable. Check bp daily, let 08/31/21 know if consistently >140 and/or >90.  Follow-up: Return for As scheduled.   Future Appointments  Date Time Provider Department Center  08/18/2021 10:30 AM [facility] NURSE [facility]  08/22/2021 11:30 AM [facility] 08/24/2021 [facility] None  08/22/2021  1:30 PM 08/24/2021, CNM [facility]  08/25/2021  9:50 AM [facility] NURSE [facility]  08/29/2021 11:30 AM [facility] 08/31/2021 [facility] None  08/29/2021  1:30 PM 08/31/2021, DO [facility]  09/01/2021 10:30 AM [facility] NURSE [facility]  09/08/2021 10:30 AM [facility] NURSE [facility]  09/12/2021  9:15 AM [facility] 11/12/2021 [facility] None  09/12/2021 10:30 AM 11/12/2021, DO [facility]  09/15/2021 10:50 AM [facility] NURSE [facility]  09/19/2021  8:30 AM [facility] 09/21/2021 [facility] None  09/19/2021  9:30 AM 09/21/2021, CNM [facility]  09/22/2021 10:50 AM [facility] NURSE [facility]    Orders Placed This Encounter  Procedures   POC Urinalysis Dipstick OB   09/24/2021 CNM 08/15/2021 4:45 PM

## 2021-08-15 NOTE — Progress Notes (Signed)
[place] 33+3 wks,cephalic,BPP 8/8,FHR 137 bpm,AFI 11.5 cm,posterior placenta gr 3,RI .56,.59,.56=36%

## 2021-08-16 ENCOUNTER — Other Ambulatory Visit: Payer: Self-pay | Admitting: Obstetrics & Gynecology

## 2021-08-18 ENCOUNTER — Encounter: Payer: Self-pay | Admitting: Advanced Practice Midwife

## 2021-08-18 ENCOUNTER — Encounter: Payer: Medicaid Other | Admitting: Advanced Practice Midwife

## 2021-08-18 ENCOUNTER — Ambulatory Visit (INDEPENDENT_AMBULATORY_CARE_PROVIDER_SITE_OTHER): Payer: Medicaid Other | Admitting: *Deleted

## 2021-08-18 ENCOUNTER — Ambulatory Visit: Payer: Medicaid Other

## 2021-08-18 VITALS — BP 119/76 | HR 86 | Wt 180.0 lb

## 2021-08-18 DIAGNOSIS — O0990 Supervision of high risk pregnancy, unspecified, unspecified trimester: Secondary | ICD-10-CM

## 2021-08-18 DIAGNOSIS — O10913 Unspecified pre-existing hypertension complicating pregnancy, third trimester: Secondary | ICD-10-CM | POA: Diagnosis not present

## 2021-08-18 DIAGNOSIS — O288 Other abnormal findings on antenatal screening of mother: Secondary | ICD-10-CM | POA: Diagnosis not present

## 2021-08-18 DIAGNOSIS — Z1389 Encounter for screening for other disorder: Secondary | ICD-10-CM

## 2021-08-18 DIAGNOSIS — Z331 Pregnant state, incidental: Secondary | ICD-10-CM

## 2021-08-18 LAB — POCT URINALYSIS DIPSTICK OB
Bilirubin, UA: NEGATIVE
Blood, UA: NEGATIVE
Glucose, UA: NEGATIVE
Ketones, UA: NEGATIVE
Nitrite, UA: NEGATIVE
POC,PROTEIN,UA: NEGATIVE

## 2021-08-18 NOTE — Progress Notes (Addendum)
   NURSE VISIT- NST  SUBJECTIVE:  Debra Glass is a 36 y.o. [id number] female at [redacted]w[redacted]d, here for a NST for pregnancy complicated by [facility].  She reports active fetal movement, contractions: none, vaginal bleeding: none, membranes: intact.   OBJECTIVE:  BP 119/76   Pulse 86   Wt 180 lb (81.6 kg)   LMP 12/24/2020   BMI 29.05 kg/m   Appears well, no apparent distress  Results for orders placed or performed in visit on 08/18/21 (from the past 24 hour(s))  POC Urinalysis Dipstick OB   Collection Time: 08/18/21 10:56 AM  Result Value Ref Range   Color, UA     Clarity, UA     Glucose, UA Negative Negative   Bilirubin, UA neg    Ketones, UA neg    Spec Grav, UA     Blood, UA neg    pH, UA     POC,PROTEIN,UA Negative Negative, Trace, Small (1+), Moderate (2+), Large (3+), 4+   Urobilinogen, UA     Nitrite, UA neg    Leukocytes, UA Small (1+) (A) Negative   Appearance     Odor      NST: FHR baseline 130 bpm, Variability: moderate, Accelerations:present, Decelerations:  Absent= Cat 1/reactive Toco: none   ASSESSMENT: [id number] at [redacted]w[redacted]d with CHTN NST reactive  PLAN: EFM strip reviewed by Cathie Beams, CNM   Recommendations: keep next appointment as scheduled    [name]  08/18/2021 11:17 AM  Chart reviewed for nurse visit. Agree with plan of care.  Jacklyn Shell, CNM 08/18/2021 1:40 PM

## 2021-08-19 ENCOUNTER — Other Ambulatory Visit: Payer: Self-pay | Admitting: Obstetrics & Gynecology

## 2021-08-19 DIAGNOSIS — O10919 Unspecified pre-existing hypertension complicating pregnancy, unspecified trimester: Secondary | ICD-10-CM

## 2021-08-22 ENCOUNTER — Ambulatory Visit (INDEPENDENT_AMBULATORY_CARE_PROVIDER_SITE_OTHER): Payer: Medicaid Other | Admitting: Women's Health

## 2021-08-22 ENCOUNTER — Ambulatory Visit (INDEPENDENT_AMBULATORY_CARE_PROVIDER_SITE_OTHER): Payer: Medicaid Other

## 2021-08-22 ENCOUNTER — Encounter: Payer: Self-pay | Admitting: Women's Health

## 2021-08-22 VITALS — BP 126/76 | HR 102 | Wt 179.4 lb

## 2021-08-22 DIAGNOSIS — O0990 Supervision of high risk pregnancy, unspecified, unspecified trimester: Secondary | ICD-10-CM

## 2021-08-22 DIAGNOSIS — O10913 Unspecified pre-existing hypertension complicating pregnancy, third trimester: Secondary | ICD-10-CM

## 2021-08-22 DIAGNOSIS — Z23 Encounter for immunization: Secondary | ICD-10-CM | POA: Diagnosis not present

## 2021-08-22 DIAGNOSIS — O10919 Unspecified pre-existing hypertension complicating pregnancy, unspecified trimester: Secondary | ICD-10-CM

## 2021-08-22 DIAGNOSIS — O0993 Supervision of high risk pregnancy, unspecified, third trimester: Secondary | ICD-10-CM

## 2021-08-22 DIAGNOSIS — Z3A34 34 weeks gestation of pregnancy: Secondary | ICD-10-CM

## 2021-08-22 LAB — POCT URINALYSIS DIPSTICK OB
Blood, UA: NEGATIVE
Glucose, UA: NEGATIVE
Ketones, UA: NEGATIVE
Leukocytes, UA: NEGATIVE
Nitrite, UA: NEGATIVE
POC,PROTEIN,UA: NEGATIVE

## 2021-08-22 NOTE — Progress Notes (Signed)
HIGH-RISK PREGNANCY VISIT Patient name: Debra Glass MRN [id number]  Date of birth: 1985-06-19 Chief Complaint:   Routine Prenatal Visit  History of Present Illness:   Debra Glass is a 36 y.o. [id number] female at [redacted]w[redacted]d with an Estimated Date of Delivery: 09/30/21 being seen today for ongoing management of a high-risk pregnancy complicated by chronic hypertension currently on labetalol 200mg  BID and h/o FGR/GHTN/complete abruption/IUFD/DIC.    Today she reports no complaints. Contractions: Not present. Vag. Bleeding: None.  Movement: Present. denies leaking of fluid.      07/11/2021    9:48 AM 03/18/2021   11:04 AM 05/26/2020    9:02 AM 02/13/2020   10:18 AM 12/31/2019   10:14 AM  Depression screen PHQ 2/9  Decreased Interest 0 0 0 0 0  Down, Depressed, Hopeless 0 0 0 0 0  PHQ - 2 Score 0 0 0 0 0  Altered sleeping 0 0 0 0 0  Tired, decreased energy 1 0 0 0 2  Change in appetite 0 0 0 0 1  Feeling bad or failure about yourself  0 0 0 0 0  Trouble concentrating 0 0 0 0 0  Moving slowly or fidgety/restless 0 0 0 0 0  Suicidal thoughts 0 0 0 0 0  PHQ-9 Score 1 0 0 0 3        07/11/2021    9:49 AM 03/18/2021   11:05 AM 05/26/2020    9:02 AM 02/13/2020   10:19 AM  GAD 7 : Generalized Anxiety Score  Nervous, Anxious, on Edge 0 0 0 0  Control/stop worrying 0 0 0 0  Worry too much - different things 0 0 0 0  Trouble relaxing 0 0 0 0  Restless 0 0 0 0  Easily annoyed or irritable 0 0 0 0  Afraid - awful might happen 0 0 0 0  Total GAD 7 Score 0 0 0 0     Review of Systems:   Pertinent items are noted in HPI Denies abnormal vaginal discharge w/ itching/odor/irritation, headaches, visual changes, shortness of breath, chest pain, abdominal pain, severe nausea/vomiting, or problems with urination or bowel movements unless otherwise stated above. Pertinent History Reviewed:  Reviewed past medical,surgical, social, obstetrical and family history.  Reviewed problem list,  medications and allergies. Physical Assessment:   Vitals:   08/22/21 1212  BP: 126/76  Pulse: (!) 102  Weight: 179 lb 6.4 oz (81.4 kg)  Body mass index is 28.96 kg/m.           Physical Examination:   General appearance: alert, well appearing, and in no distress  Mental status: alert, oriented to person, place, and time  Skin: warm & dry   Extremities: Edema: None    Cardiovascular: normal heart rate noted  Respiratory: normal respiratory effort, no distress  Abdomen: gravid, soft, non-tender  Pelvic: Cervical exam deferred         Fetal Status: Fetal Heart Rate (bpm): 142 u/s   Movement: Present    Fetal Surveillance Testing today: 08/24/21 34+3 wks,cephalic,BPP 8/8,FHR 142 bpm,AFI 12 cm,posterior placenta gr 3,LVEICF,RI .59,.61,.56=50%  Chaperone: N/A    Results for orders placed or performed in visit on 08/22/21 (from the past 24 hour(s))  POC Urinalysis Dipstick OB   Collection Time: 08/22/21 12:17 PM  Result Value Ref Range   Color, UA     Clarity, UA     Glucose, UA Negative Negative   Bilirubin, UA     Ketones,  UA neg    Spec Grav, UA     Blood, UA neg    pH, UA     POC,PROTEIN,UA Negative Negative, Trace, Small (1+), Moderate (2+), Large (3+), 4+   Urobilinogen, UA     Nitrite, UA neg    Leukocytes, UA Negative Negative   Appearance     Odor      Assessment & Plan:  High-risk pregnancy: [id number] at [redacted]w[redacted]d with an Estimated Date of Delivery: 09/30/21   1) CHTN, stable on labetalol 200mg  BID, ASA  2) H/O 31.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 31  Meds: No orders of the defined types were placed in this encounter.   Labs/procedures today: tdap and U/S  Treatment Plan:  Charlotta Newton q4wks    2x/wk testing nst/sono @ 32wks     Deliver 37-39wks (36-37wks or prn if poor  control)____   Reviewed: Preterm labor symptoms and general obstetric precautions including but not limited to vaginal bleeding, contractions, leaking of fluid and fetal movement were reviewed in detail with the patient.  All questions were answered. Does have home bp cuff. Office bp cuff given: not applicable. Check bp daily, let us know if consistently >140 and/or >90.  Follow-up: Return for As scheduled 8/24 nurse/nst, 8/28 HROB & u/s.   Future Appointments  Date Time Provider Department Center  08/22/2021  1:30 PM 08/24/2021, Cheral Marker [facility]  08/25/2021  9:50 AM [facility] NURSE [facility]  08/29/2021 11:30 AM [facility] 08/31/2021 [facility] None  08/29/2021  1:30 PM 08/31/2021, DO [facility]  09/01/2021 10:30 AM [facility] NURSE [facility]  09/08/2021 10:30 AM [facility] NURSE [facility]  09/12/2021  9:15 AM [facility] 11/12/2021 [facility] None  09/12/2021 10:30 AM 11/12/2021, DO [facility]  09/15/2021 10:50 AM [facility] NURSE [facility]  09/19/2021  8:30 AM [facility] 09/21/2021 [facility] None  09/19/2021  9:30 AM 09/21/2021, CNM [facility]  09/22/2021 10:50 AM [facility] NURSE [facility]    Orders Placed This Encounter  Procedures   Tdap vaccine greater than or equal to 7yo IM   POC Urinalysis Dipstick OB   09/24/2021 CNM, [facility] 08/22/2021 12:41 PM

## 2021-08-22 NOTE — Progress Notes (Signed)
[place] 34+3 wks,cephalic,BPP 8/8,FHR 142 bpm,AFI 12 cm,posterior placenta gr 3,LVEICF,RI .59,.61,.56=50%

## 2021-08-22 NOTE — Patient Instructions (Signed)
Debra Glass, thank you for choosing our office today! We appreciate the opportunity to meet your healthcare needs. You may receive a short survey by mail, e-mail, or through [employer]. If you are happy with your care we would appreciate if you could take just a few minutes to complete the survey questions. We read all of your comments and take your feedback very seriously. Thank you again for choosing our office.  Center for [employer] Team at [facility]  [facility] at [facility] ([address] [place], [place] [address]) Entrance C, located off of [address] Free 24/7 valet parking   CLASSES: Go to [email or web address] to register for classes (childbirth, breastfeeding, waterbirth, infant CPR, daddy bootcamp, etc.)  Call the office [phone]) or go to [facility] if: You begin to have strong, frequent contractions Your water breaks.  Sometimes it is a big gush of fluid, sometimes it is just a trickle that keeps getting your panties wet or running down your legs You have vaginal bleeding.  It is normal to have a small amount of spotting if your cervix was checked.  You don't feel your baby moving like normal.  If you don't, get you something to eat and drink and lay down and focus on feeling your baby move.   If your baby is still not moving like normal, you should call the office or go to [facility].  Call the office [phone]) or go to [facility] hospital for these signs of pre-eclampsia: Severe headache that does not go away with Tylenol Visual changes- seeing spots, double, blurred vision Pain under your right breast or upper abdomen that does not go away with Tums or heartburn medicine Nausea and/or vomiting Severe swelling in your hands, feet, and face   Tdap Vaccine It is recommended that you get the Tdap vaccine during the third trimester of EACH pregnancy to help protect your baby from getting pertussis (whooping cough) 27-36 weeks is the BEST time to  do this so that you can pass the protection on to your baby. During pregnancy is better than after pregnancy, but if you are unable to get it during pregnancy it will be offered at the hospital.  You can get this vaccine with [place], at the health department, your family doctor, or some local pharmacies Everyone who will be around your baby should also be up-to-date on their vaccines before the baby comes. Adults (who are not pregnant) only need 1 dose of Tdap during adulthood.   [facility] Pediatricians/Family Doctors [facility]): [address] [place], [phone]           [facility]: [address], [phone]                [facility]): [address], [phone] (call to ask if accepting patients) [facility] Department: [address], [place], [phone]    [facility] Pediatricians/Family Doctors [facility]): [phone] [address], [phone] [facility]: [address] [place], [phone] [facility]: [address], [phone]  [facility] Doctors  [facility]): [phone] [facility]: [address] [phone]   [facility] Doctors [facility]: [address] [phone]  [facility] Family Doctors  [facility]: [phone], [phone]  Home Blood Pressure Monitoring for Patients   Your provider has recommended that you check your  blood pressure (BP) at least once a week at home. If you do not have a blood pressure cuff at home, one will be provided for you. Contact your provider if you have not received your monitor within 1 week.   Helpful Tips for Accurate Home Blood Pressure Checks  Don't smoke, exercise, or drink caffeine 30 minutes before checking your BP Use the restroom before checking your BP (a full bladder can raise your  pressure) Relax in a comfortable upright chair Feet on the ground Left arm resting comfortably on a flat surface at the level of your heart Legs uncrossed Back supported Sit quietly and don't talk Place the cuff on your bare arm Adjust snuggly, so that only two fingertips can fit between your skin and the top of the cuff Check 2 readings separated by at least one minute Keep a log of your BP readings For a visual, please reference this diagram: http://ccnc.care/bpdiagram  Provider Name: [facility]     Phone: [phone]  Zone 1: ALL CLEAR  Continue to monitor your symptoms:  BP reading is less than 140 (top number) or less than 90 (bottom number)  No right upper stomach pain No headaches or seeing spots No feeling nauseated or throwing up No swelling in face and hands  Zone 2: CAUTION Call your doctor's office for any of the following:  BP reading is greater than 140 (top number) or greater than 90 (bottom number)  Stomach pain under your ribs in the middle or right side Headaches or seeing spots Feeling nauseated or throwing up Swelling in face and hands  Zone 3: EMERGENCY  Seek immediate medical care if you have any of the following:  BP reading is greater than160 (top number) or greater than 110 (bottom number) Severe headaches not improving with Tylenol Serious difficulty catching your breath Any worsening symptoms from Zone 2  Preterm Labor and Birth Information  The normal length of a pregnancy is 39-41 weeks. Preterm labor is when labor starts before 37 completed weeks of pregnancy. What are the risk factors for preterm labor? Preterm labor is more likely to occur in women who: Have certain infections during pregnancy such as a bladder infection, sexually transmitted infection, or infection inside the uterus (chorioamnionitis). Have a shorter-than-normal cervix. Have gone into preterm labor before. Have had surgery on their cervix. Are younger than age 17  or older than age 35. Are African American. Are pregnant with twins or multiple babies (multiple gestation). Take street drugs or smoke while pregnant. Do not gain enough weight while pregnant. Became pregnant shortly after having been pregnant. What are the symptoms of preterm labor? Symptoms of preterm labor include: Cramps similar to those that can happen during a menstrual period. The cramps may happen with diarrhea. Pain in the abdomen or lower back. Regular uterine contractions that may feel like tightening of the abdomen. A feeling of increased pressure in the pelvis. Increased watery or bloody mucus discharge from the vagina. Water breaking (ruptured amniotic sac). Why is it important to recognize signs of preterm labor? It is important to recognize signs of preterm labor because babies who are born prematurely may not be fully developed. This can put them at an increased risk for: Long-term (chronic) heart and lung problems. Difficulty immediately after birth with regulating body systems, including blood sugar, body temperature, heart rate, and breathing rate. Bleeding in the brain. Cerebral palsy. Learning difficulties. Death. These risks are highest for babies who are born before 34 weeks   of pregnancy. How is preterm labor treated? Treatment depends on the length of your pregnancy, your condition, and the health of your baby. It may involve: Having a stitch (suture) placed in your cervix to prevent your cervix from opening too early (cerclage). Taking or being given medicines, such as: Hormone medicines. These may be given early in pregnancy to help support the pregnancy. Medicine to stop contractions. Medicines to help mature the baby's lungs. These may be prescribed if the risk of delivery is high. Medicines to prevent your baby from developing cerebral palsy. If the labor happens before 34 weeks of pregnancy, you may need to stay in the hospital. What should I do if I  think I am in preterm labor? If you think that you are going into preterm labor, call your health care provider right away. How can I prevent preterm labor in future pregnancies? To increase your chance of having a full-term pregnancy: Do not use any tobacco products, such as cigarettes, chewing tobacco, and e-cigarettes. If you need help quitting, ask your health care provider. Do not use street drugs or medicines that have not been prescribed to you during your pregnancy. Talk with your health care provider before taking any herbal supplements, even if you have been taking them regularly. Make sure you gain a healthy amount of weight during your pregnancy. Watch for infection. If you think that you might have an infection, get it checked right away. Make sure to tell your health care provider if you have gone into preterm labor before. This information is not intended to replace advice given to you by your health care provider. Make sure you discuss any questions you have with your health care provider. Document Revised: 04/12/2018 Document Reviewed: 05/12/2015 Elsevier Patient Education  2020 Elsevier Inc.   

## 2021-08-25 ENCOUNTER — Ambulatory Visit (INDEPENDENT_AMBULATORY_CARE_PROVIDER_SITE_OTHER): Payer: Medicaid Other | Admitting: *Deleted

## 2021-08-25 VITALS — BP 127/80 | HR 90 | Wt 180.0 lb

## 2021-08-25 DIAGNOSIS — Z331 Pregnant state, incidental: Secondary | ICD-10-CM

## 2021-08-25 DIAGNOSIS — O10913 Unspecified pre-existing hypertension complicating pregnancy, third trimester: Secondary | ICD-10-CM

## 2021-08-25 DIAGNOSIS — O288 Other abnormal findings on antenatal screening of mother: Secondary | ICD-10-CM | POA: Diagnosis not present

## 2021-08-25 DIAGNOSIS — O0990 Supervision of high risk pregnancy, unspecified, unspecified trimester: Secondary | ICD-10-CM | POA: Diagnosis not present

## 2021-08-25 DIAGNOSIS — Z1389 Encounter for screening for other disorder: Secondary | ICD-10-CM

## 2021-08-25 LAB — POCT URINALYSIS DIPSTICK OB
Blood, UA: NEGATIVE
Glucose, UA: NEGATIVE
Ketones, UA: NEGATIVE
Leukocytes, UA: NEGATIVE
Nitrite, UA: NEGATIVE

## 2021-08-25 NOTE — Progress Notes (Signed)
   NURSE VISIT- NST  SUBJECTIVE:  Debra Glass is a 36 y.o. C6C3762 female at [redacted]w[redacted]d, here for a NST for pregnancy complicated by [facility].  She reports active fetal movement, contractions: none, vaginal bleeding: none, membranes: intact.   OBJECTIVE:  BP 127/80   Pulse 90   Wt 180 lb (81.6 kg)   LMP 12/24/2020   BMI 29.05 kg/m   Appears well, no apparent distress  Results for orders placed or performed in visit on 08/25/21 (from the past 24 hour(s))  POC Urinalysis Dipstick OB   Collection Time: 08/25/21 10:13 AM  Result Value Ref Range   Color, UA     Clarity, UA     Glucose, UA Negative Negative   Bilirubin, UA     Ketones, UA neg    Spec Grav, UA     Blood, UA neg    pH, UA     POC,PROTEIN,UA Trace Negative, Trace, Small (1+), Moderate (2+), Large (3+), 4+   Urobilinogen, UA     Nitrite, UA neg    Leukocytes, UA Negative Negative   Appearance     Odor      NST: FHR baseline 140 bpm, Variability: moderate, Accelerations:present, Decelerations:  Absent= Cat 1/reactive Toco: none   ASSESSMENT: [id number] at [redacted]w[redacted]d with CHTN NST reactive  PLAN: EFM strip reviewed by Dr. Charlotta Newton   Recommendations: keep next appointment as scheduled    [name]  08/25/2021 12:06 PM

## 2021-08-26 ENCOUNTER — Other Ambulatory Visit: Payer: Self-pay | Admitting: Women's Health

## 2021-08-26 DIAGNOSIS — O10919 Unspecified pre-existing hypertension complicating pregnancy, unspecified trimester: Secondary | ICD-10-CM

## 2021-08-29 ENCOUNTER — Encounter: Payer: Self-pay | Admitting: Obstetrics & Gynecology

## 2021-08-29 ENCOUNTER — Other Ambulatory Visit (HOSPITAL_COMMUNITY)
Admission: RE | Admit: 2021-08-29 | Discharge: 2021-08-29 | Disposition: A | Discharge status: Home / Self Care | Payer: Medicaid Other | Source: Ambulatory Visit | Attending: Obstetrics & Gynecology | Admitting: Obstetrics & Gynecology

## 2021-08-29 ENCOUNTER — Ambulatory Visit (INDEPENDENT_AMBULATORY_CARE_PROVIDER_SITE_OTHER): Payer: Medicaid Other | Admitting: Obstetrics & Gynecology

## 2021-08-29 ENCOUNTER — Ambulatory Visit (INDEPENDENT_AMBULATORY_CARE_PROVIDER_SITE_OTHER): Payer: Medicaid Other

## 2021-08-29 VITALS — BP 129/83 | HR 85 | Wt 181.0 lb

## 2021-08-29 DIAGNOSIS — O0990 Supervision of high risk pregnancy, unspecified, unspecified trimester: Secondary | ICD-10-CM | POA: Diagnosis present

## 2021-08-29 DIAGNOSIS — O10919 Unspecified pre-existing hypertension complicating pregnancy, unspecified trimester: Secondary | ICD-10-CM

## 2021-08-29 DIAGNOSIS — Z3A35 35 weeks gestation of pregnancy: Secondary | ICD-10-CM | POA: Diagnosis present

## 2021-08-29 DIAGNOSIS — Z98891 History of uterine scar from previous surgery: Secondary | ICD-10-CM

## 2021-08-29 DIAGNOSIS — O10913 Unspecified pre-existing hypertension complicating pregnancy, third trimester: Secondary | ICD-10-CM

## 2021-08-29 NOTE — Addendum Note (Signed)
Addended by: Annamarie Dawley on: 08/29/2021 02:33 PM   Modules accepted: Orders

## 2021-08-29 NOTE — Progress Notes (Signed)
[place] 35+3 wks,cephalic,BPP 8/8,posterior placenta gr 3,RI .61,.63,.55,.52=44%,AFI 15.6 cm,FHR 124 bpm

## 2021-08-29 NOTE — Progress Notes (Signed)
HIGH-RISK PREGNANCY VISIT Patient name: Debra Glass MRN [id number]  Date of birth: February 26, 1985 Chief Complaint:   Routine Prenatal Visit  History of Present Illness:   Debra Glass is a 36 y.o. N8G9562 female at [redacted]w[redacted]d with an Estimated Date of Delivery: 09/30/21 being seen today for ongoing management of a high-risk pregnancy complicated by:  -Chronic HTN doing well with current medication Asymptomatic  Today she reports no complaints.   Contractions: Irritability. Vag. Bleeding: None.  Movement: Present. denies leaking of fluid.      07/11/2021    9:48 AM 03/18/2021   11:04 AM 05/26/2020    9:02 AM 02/13/2020   10:18 AM 12/31/2019   10:14 AM  Depression screen PHQ 2/9  Decreased Interest 0 0 0 0 0  Down, Depressed, Hopeless 0 0 0 0 0  PHQ - 2 Score 0 0 0 0 0  Altered sleeping 0 0 0 0 0  Tired, decreased energy 1 0 0 0 2  Change in appetite 0 0 0 0 1  Feeling bad or failure about yourself  0 0 0 0 0  Trouble concentrating 0 0 0 0 0  Moving slowly or fidgety/restless 0 0 0 0 0  Suicidal thoughts 0 0 0 0 0  PHQ-9 Score 1 0 0 0 3     Current Outpatient Medications  Medication Instructions   aspirin EC 162 mg, Oral, Daily, Swallow whole.   Blood Pressure Monitor MISC For regular home bp monitoring during pregnancy   labetalol (NORMODYNE) 100 MG tablet TAKE 2 TABLETS(200 MG) BY MOUTH TWICE DAILY   omeprazole (PRILOSEC OTC) 20 mg, Oral, 2 times daily   Prenatal Vit-Fe Fumarate-FA (PRENATAL PO) Oral     Review of Systems:   Pertinent items are noted in HPI Denies abnormal vaginal discharge w/ itching/odor/irritation, headaches, visual changes, shortness of breath, chest pain, abdominal pain, severe nausea/vomiting, or problems with urination or bowel movements unless otherwise stated above. Pertinent History Reviewed:  Reviewed past medical,surgical, social, obstetrical and family history.  Reviewed problem list, medications and allergies. Physical Assessment:    Vitals:   08/29/21 1213  BP: 129/83  Pulse: 85  Weight: 181 lb (82.1 kg)  Body mass index is 29.21 kg/m.           Physical Examination:   General appearance: alert, well appearing, and in no distress  Mental status: normal mood, behavior, speech, dress, motor activity, and thought processes  Skin: warm & dry   Extremities: Edema: Trace    Cardiovascular: normal heart rate noted  Respiratory: normal respiratory effort, no distress  Abdomen: gravid, soft, non-tender  Pelvic: Cervical exam performed  Dilation: Closed Effacement (%): Thick Station: -3  Fetal Status:     Movement: Present    Fetal Surveillance Testing today: cephalic,BPP 8/8,posterior placenta gr 3,RI .61,.63,.55,.52=44%,AFI 15.6 cm,FHR 124 bpm   Chaperone:  Dr. Jena Gauss     No results found for this or any previous visit (from the past 24 hour(s)).   Assessment & Plan:  High-risk pregnancy: [id number] at [redacted]w[redacted]d with an Estimated Date of Delivery: 09/30/21   1) Chronic HTN -doing well with current medication -continue twice weekly appointments -reviewed IOL 37-39wk  2) prior C-section, desires TOLAC- consent obtained today  3) Contraception- reviewed options, pt reports wanting BTL Risk/benefit reviewed with patient- discussed that due to insurance it may need to be postponed until postpartum time frame  Meds: No orders of the defined types were placed in this encounter.  Labs/procedures today: GBS, GC/C collected  Treatment Plan:  as above  Reviewed: Preterm labor symptoms and general obstetric precautions including but not limited to vaginal bleeding, contractions, leaking of fluid and fetal movement were reviewed in detail with the patient.  All questions were answered. PT has home bp cuff. Check bp weekly, let us know if >140/90.   Follow-up: Return in about 1 week (around 09/05/2021) for as scheduled.   Future Appointments  Date Time Provider Department Center  08/29/2021  1:30 PM Myna Hidalgo, DO  [facility]  09/01/2021 10:30 AM [facility] NURSE [facility]  09/08/2021 10:30 AM [facility] NURSE [facility]  09/12/2021  9:15 AM [facility] [place] [facility] None  09/12/2021 10:30 AM Myna Hidalgo, DO [facility]  09/15/2021 10:50 AM [facility] NURSE [facility]  09/19/2021  8:30 AM [facility] [place] [facility] None  09/19/2021  9:30 AM Cheral Marker, CNM [facility]  09/22/2021 10:50 AM [facility] NURSE [facility]    No orders of the defined types were placed in this encounter.   Myna Hidalgo, DO Attending Obstetrician & Gynecologist, [facility] for [employer], [facility]

## 2021-08-31 LAB — CERVICOVAGINAL ANCILLARY ONLY
Chlamydia: NEGATIVE
Comment: NEGATIVE
Comment: NORMAL
Neisseria Gonorrhea: NEGATIVE

## 2021-09-01 ENCOUNTER — Ambulatory Visit: Payer: Medicaid Other

## 2021-09-01 ENCOUNTER — Encounter: Payer: Medicaid Other | Admitting: Obstetrics & Gynecology

## 2021-09-01 ENCOUNTER — Ambulatory Visit (INDEPENDENT_AMBULATORY_CARE_PROVIDER_SITE_OTHER): Payer: Medicaid Other | Admitting: *Deleted

## 2021-09-01 VITALS — BP 124/83 | HR 87 | Wt 184.0 lb

## 2021-09-01 DIAGNOSIS — O0990 Supervision of high risk pregnancy, unspecified, unspecified trimester: Secondary | ICD-10-CM | POA: Diagnosis not present

## 2021-09-01 DIAGNOSIS — Z3A35 35 weeks gestation of pregnancy: Secondary | ICD-10-CM

## 2021-09-01 DIAGNOSIS — O10913 Unspecified pre-existing hypertension complicating pregnancy, third trimester: Secondary | ICD-10-CM

## 2021-09-01 LAB — POCT URINALYSIS DIPSTICK OB
Blood, UA: NEGATIVE
Glucose, UA: NEGATIVE
Ketones, UA: NEGATIVE
Leukocytes, UA: NEGATIVE
Nitrite, UA: NEGATIVE
POC,PROTEIN,UA: NEGATIVE

## 2021-09-01 NOTE — Progress Notes (Signed)
   NURSE VISIT- NST  SUBJECTIVE:  Debra Glass is a 36 y.o. G6K5993 female at [redacted]w[redacted]d, here for a NST for pregnancy complicated by [facility].  She reports active fetal movement, contractions: none, vaginal bleeding: none, membranes: intact.   OBJECTIVE:  BP 124/83   Pulse 87   Wt 184 lb (83.5 kg)   LMP 12/24/2020   BMI 29.70 kg/m   Appears well, no apparent distress  Results for orders placed or performed in visit on 09/01/21 (from the past 24 hour(s))  POC Urinalysis Dipstick OB   Collection Time: 09/01/21 11:07 AM  Result Value Ref Range   Color, UA     Clarity, UA     Glucose, UA Negative Negative   Bilirubin, UA     Ketones, UA neg    Spec Grav, UA     Blood, UA neg    pH, UA     POC,PROTEIN,UA Negative Negative, Trace, Small (1+), Moderate (2+), Large (3+), 4+   Urobilinogen, UA     Nitrite, UA neg    Leukocytes, UA Negative Negative   Appearance     Odor      NST: FHR baseline 140 bpm, Variability: moderate, Accelerations:present, Decelerations:  Absent= Cat 1/reactive Toco: none   ASSESSMENT: [id number] at [redacted]w[redacted]d with CHTN NST reactive  PLAN: EFM strip reviewed by Dr. Charlotta Newton   Recommendations: keep next appointment as scheduled    [name]  09/01/2021 11:21 AM

## 2021-09-02 LAB — CULTURE, BETA STREP (GROUP B ONLY): Strep Gp B Culture: NEGATIVE

## 2021-09-06 ENCOUNTER — Other Ambulatory Visit: Payer: Medicaid Other

## 2021-09-08 ENCOUNTER — Ambulatory Visit (INDEPENDENT_AMBULATORY_CARE_PROVIDER_SITE_OTHER): Payer: Medicaid Other | Admitting: *Deleted

## 2021-09-08 ENCOUNTER — Encounter: Payer: Medicaid Other | Admitting: Advanced Practice Midwife

## 2021-09-08 ENCOUNTER — Ambulatory Visit: Payer: Medicaid Other

## 2021-09-08 VITALS — BP 133/82 | HR 96 | Wt 187.0 lb

## 2021-09-08 DIAGNOSIS — O0990 Supervision of high risk pregnancy, unspecified, unspecified trimester: Secondary | ICD-10-CM

## 2021-09-08 DIAGNOSIS — O10913 Unspecified pre-existing hypertension complicating pregnancy, third trimester: Secondary | ICD-10-CM | POA: Diagnosis not present

## 2021-09-08 DIAGNOSIS — Z331 Pregnant state, incidental: Secondary | ICD-10-CM

## 2021-09-08 DIAGNOSIS — O288 Other abnormal findings on antenatal screening of mother: Secondary | ICD-10-CM | POA: Diagnosis not present

## 2021-09-08 DIAGNOSIS — Z1389 Encounter for screening for other disorder: Secondary | ICD-10-CM

## 2021-09-08 LAB — POCT URINALYSIS DIPSTICK OB
Blood, UA: NEGATIVE
Glucose, UA: NEGATIVE
Ketones, UA: NEGATIVE
Leukocytes, UA: NEGATIVE
Nitrite, UA: NEGATIVE
POC,PROTEIN,UA: NEGATIVE

## 2021-09-08 NOTE — Progress Notes (Signed)
   NURSE VISIT- NST  SUBJECTIVE:  Debra Glass is a 36 y.o. D4Y8144 female at [redacted]w[redacted]d, here for a NST for pregnancy complicated by [facility].  She reports active fetal movement, contractions: occasional, vaginal bleeding: none, membranes: intact.   OBJECTIVE:  BP 133/82   Pulse 96   Wt 187 lb (84.8 kg)   LMP 12/24/2020   BMI 30.18 kg/m   Appears well, no apparent distress  Results for orders placed or performed in visit on 09/08/21 (from the past 24 hour(s))  POC Urinalysis Dipstick OB   Collection Time: 09/08/21 10:52 AM  Result Value Ref Range   Color, UA     Clarity, UA     Glucose, UA Negative Negative   Bilirubin, UA     Ketones, UA neg    Spec Grav, UA     Blood, UA neg    pH, UA     POC,PROTEIN,UA Negative Negative, Trace, Small (1+), Moderate (2+), Large (3+), 4+   Urobilinogen, UA     Nitrite, UA neg    Leukocytes, UA Negative Negative   Appearance     Odor      NST: FHR baseline 125 bpm, Variability: moderate, Accelerations:present, Decelerations:  Absent= Cat 1/reactive Toco: occasional   ASSESSMENT: [id number] at [redacted]w[redacted]d with CHTN NST reactive  PLAN: EFM strip reviewed by Cathie Beams, CNM   Recommendations: keep next appointment as scheduled    [name]  09/08/2021 11:21 AM

## 2021-09-09 ENCOUNTER — Other Ambulatory Visit: Payer: Self-pay | Admitting: Obstetrics & Gynecology

## 2021-09-09 DIAGNOSIS — O10919 Unspecified pre-existing hypertension complicating pregnancy, unspecified trimester: Secondary | ICD-10-CM

## 2021-09-12 ENCOUNTER — Encounter: Payer: Self-pay | Admitting: Obstetrics & Gynecology

## 2021-09-12 ENCOUNTER — Ambulatory Visit (INDEPENDENT_AMBULATORY_CARE_PROVIDER_SITE_OTHER): Payer: Medicaid Other

## 2021-09-12 ENCOUNTER — Ambulatory Visit (INDEPENDENT_AMBULATORY_CARE_PROVIDER_SITE_OTHER): Payer: Medicaid Other | Admitting: Obstetrics & Gynecology

## 2021-09-12 VITALS — BP 136/88 | HR 82 | Wt 185.8 lb

## 2021-09-12 DIAGNOSIS — Z3A37 37 weeks gestation of pregnancy: Secondary | ICD-10-CM

## 2021-09-12 DIAGNOSIS — O10919 Unspecified pre-existing hypertension complicating pregnancy, unspecified trimester: Secondary | ICD-10-CM | POA: Diagnosis not present

## 2021-09-12 DIAGNOSIS — O0990 Supervision of high risk pregnancy, unspecified, unspecified trimester: Secondary | ICD-10-CM

## 2021-09-12 LAB — POCT URINALYSIS DIPSTICK OB
Blood, UA: NEGATIVE
Glucose, UA: NEGATIVE
Ketones, UA: NEGATIVE
Nitrite, UA: NEGATIVE
POC,PROTEIN,UA: NEGATIVE

## 2021-09-12 NOTE — Progress Notes (Signed)
[place] 37+3 wks,cephalic,BPP 8/8,AFI 12.7 cm,FHR 144 bpm,EFW 3465 g 80%,RI .49,.49,.53=25%

## 2021-09-12 NOTE — Progress Notes (Signed)
HIGH-RISK PREGNANCY VISIT Patient name: Debra Glass MRN [id number]  Date of birth: 11-03-1985 Chief Complaint:   Routine Prenatal Visit  History of Present Illness:   Debra Glass is a 36 y.o. [id number] female at [redacted]w[redacted]d with an Estimated Date of Delivery: 09/30/21 being seen today for ongoing management of a high-risk pregnancy complicated by:  -chronic HTN- on Labetalol 200mg  bi -TOLAC- prior NSVD, then C-section due to abruption/IUFD/DIC    Today she reports no complaints.   Contractions: Irritability. Vag. Bleeding: None.  Movement: Present. denies leaking of fluid.      07/11/2021    9:48 AM 03/18/2021   11:04 AM 05/26/2020    9:02 AM 02/13/2020   10:18 AM 12/31/2019   10:14 AM  Depression screen PHQ 2/9  Decreased Interest 0 0 0 0 0  Down, Depressed, Hopeless 0 0 0 0 0  PHQ - 2 Score 0 0 0 0 0  Altered sleeping 0 0 0 0 0  Tired, decreased energy 1 0 0 0 2  Change in appetite 0 0 0 0 1  Feeling bad or failure about yourself  0 0 0 0 0  Trouble concentrating 0 0 0 0 0  Moving slowly or fidgety/restless 0 0 0 0 0  Suicidal thoughts 0 0 0 0 0  PHQ-9 Score 1 0 0 0 3     Current Outpatient Medications  Medication Instructions   aspirin EC 162 mg, Oral, Daily, Swallow whole.   Blood Pressure Monitor MISC For regular home bp monitoring during pregnancy   labetalol (NORMODYNE) 100 MG tablet TAKE 2 TABLETS(200 MG) BY MOUTH TWICE DAILY   omeprazole (PRILOSEC OTC) 20 mg, Oral, 2 times daily   Prenatal Vit-Fe Fumarate-FA (PRENATAL PO) Oral     Review of Systems:   Pertinent items are noted in HPI Denies abnormal vaginal discharge w/ itching/odor/irritation, headaches, visual changes, shortness of breath, chest pain, abdominal pain, severe nausea/vomiting, or problems with urination or bowel movements unless otherwise stated above. Pertinent History Reviewed:  Reviewed past medical,surgical, social, obstetrical and family history.  Reviewed problem list,  medications and allergies. Physical Assessment:   Vitals:   09/12/21 0957  BP: 136/88  Pulse: 82  Weight: 185 lb 12.8 oz (84.3 kg)  Body mass index is 29.99 kg/m.           Physical Examination:   General appearance: alert, well appearing, and in no distress  Mental status: normal mood, behavior, speech, dress, motor activity, and thought processes  Skin: warm & dry   Extremities: Edema: Trace    Cardiovascular: normal heart rate noted  Respiratory: normal respiratory effort, no distress  Abdomen: gravid, soft, non-tender  Pelvic: Cervical exam performed  Dilation: 2.5 Effacement (%): 60 Station: -2  Fetal Status:     Movement: Present    Fetal Surveillance Testing today: 11/12/21 37+3 wks,cephalic,BPP 8/8,AFI 12.7 cm,FHR 144 bpm,EFW 3465 g 80%,RI .49,.49,.53=25%   Chaperone: N/A    Results for orders placed or performed in visit on 09/12/21 (from the past 24 hour(s))  POC Urinalysis Dipstick OB   Collection Time: 09/12/21 10:01 AM  Result Value Ref Range   Color, UA     Clarity, UA     Glucose, UA Negative Negative   Bilirubin, UA     Ketones, UA negative    Spec Grav, UA     Blood, UA negative    pH, UA     POC,PROTEIN,UA Negative Negative, Trace, Small (1+), Moderate (2+),  Large (3+), 4+   Urobilinogen, UA     Nitrite, UA neg    Leukocytes, UA Small (1+) (A) Negative   Appearance     Odor       Assessment & Plan:  High-risk pregnancy: [id number] at [redacted]w[redacted]d with an Estimated Date of Delivery: 09/30/21   1) chronic HTN- continue current meds BPP 8/8 Scheduled for IOL/TOLAC this week  Meds: No orders of the defined types were placed in this encounter.   Labs/procedures today: BPP/growth  Treatment Plan:  as outlined above, scheduled for IOL.  Reviewed preeclampsia and labor precautions  Reviewed: Term labor symptoms and general obstetric precautions including but not limited to vaginal bleeding, contractions, leaking of fluid and fetal movement were reviewed in detail  with the patient.  All questions were answered. Pt has home bp cuff. Check bp weekly, let us know if >140/90.   Follow-up: Return for IOL scheduled for 9/13- cancel all OB appts, needs 1wk RN BP check and 5-6wk PPV.   Future Appointments  Date Time Provider Department Center  09/12/2021 10:30 AM Myna Hidalgo, DO [facility]  09/14/2021  6:45 AM [facility] SCHED ROOM [facility] None  09/15/2021 10:50 AM [facility] NURSE [facility]  09/19/2021  8:30 AM [facility] [place] [facility] None  09/19/2021  9:30 AM Cheral Marker, CNM [facility]  09/22/2021 10:50 AM [facility] NURSE [facility]    Orders Placed This Encounter  Procedures   POC Urinalysis Dipstick OB    Myna Hidalgo, DO Attending Obstetrician & Gynecologist, [facility] for [employer], [facility]

## 2021-09-13 ENCOUNTER — Other Ambulatory Visit: Payer: Self-pay | Admitting: Women's Health

## 2021-09-13 ENCOUNTER — Telehealth: Payer: Self-pay | Admitting: *Deleted

## 2021-09-13 ENCOUNTER — Ambulatory Visit (INDEPENDENT_AMBULATORY_CARE_PROVIDER_SITE_OTHER): Payer: Medicaid Other | Admitting: *Deleted

## 2021-09-13 VITALS — BP 134/86 | HR 86

## 2021-09-13 DIAGNOSIS — O0990 Supervision of high risk pregnancy, unspecified, unspecified trimester: Secondary | ICD-10-CM

## 2021-09-13 DIAGNOSIS — O10913 Unspecified pre-existing hypertension complicating pregnancy, third trimester: Secondary | ICD-10-CM

## 2021-09-13 DIAGNOSIS — Z3A37 37 weeks gestation of pregnancy: Secondary | ICD-10-CM

## 2021-09-13 DIAGNOSIS — O10919 Unspecified pre-existing hypertension complicating pregnancy, unspecified trimester: Secondary | ICD-10-CM

## 2021-09-13 LAB — POCT URINALYSIS DIPSTICK OB
Blood, UA: NEGATIVE
Glucose, UA: NEGATIVE
Ketones, UA: NEGATIVE
Leukocytes, UA: NEGATIVE
Nitrite, UA: NEGATIVE
POC,PROTEIN,UA: NEGATIVE

## 2021-09-13 NOTE — Telephone Encounter (Signed)
Pt lost mucus plug this am. Panties are wet. Pt don't think water has broke. +contractions last night, more irregular contractions today. No spotting or bleeding. Decreased baby movement since this am. Pt advised to come in for NST with nurse. Pt voiced understanding. JSY

## 2021-09-13 NOTE — Progress Notes (Signed)
   NURSE VISIT- NST  SUBJECTIVE:  Debra Glass is a 36 y.o. [id number] female at [redacted]w[redacted]d, here for a NST for pregnancy complicated by [facility].  She reports decreased  fetal movement, contractions: occasional contractions, vaginal bleeding: none, membranes: intact but states she lost her mucous plug last night   OBJECTIVE:  BP 134/86   Pulse 86   LMP 12/24/2020   Appears well, no apparent distress  Results for orders placed or performed in visit on 09/13/21 (from the past 24 hour(s))  POC Urinalysis Dipstick OB   Collection Time: 09/13/21  3:28 PM  Result Value Ref Range   Color, UA     Clarity, UA     Glucose, UA Negative Negative   Bilirubin, UA     Ketones, UA neg    Spec Grav, UA     Blood, UA neg    pH, UA     POC,PROTEIN,UA Negative Negative, Trace, Small (1+), Moderate (2+), Large (3+), 4+   Urobilinogen, UA     Nitrite, UA neg    Leukocytes, UA Negative Negative   Appearance     Odor      NST: FHR baseline 135 bpm, Variability: moderate, Accelerations:present, Decelerations:  Absent= Cat 1/reactive Toco: occasional   ASSESSMENT: [id number] at [redacted]w[redacted]d with CHTN with decreased fetal movement today NST reactive  PLAN: EFM strip reviewed by Dr. Charlotta Newton   Recommendations: keep next appointment as scheduled    [name]  09/13/2021 3:29 PM

## 2021-09-14 ENCOUNTER — Inpatient Hospital Stay (HOSPITAL_COMMUNITY)
Admission: AD | Admit: 2021-09-14 | Discharge: 2021-09-15 | DRG: 807 | Disposition: A | Discharge status: Home / Self Care | Payer: Medicaid Other | Attending: Obstetrics and Gynecology | Admitting: Obstetrics and Gynecology

## 2021-09-14 ENCOUNTER — Inpatient Hospital Stay (HOSPITAL_COMMUNITY): Payer: Medicaid Other | Admitting: Anesthesiology

## 2021-09-14 ENCOUNTER — Other Ambulatory Visit: Payer: Self-pay

## 2021-09-14 ENCOUNTER — Encounter (HOSPITAL_COMMUNITY): Payer: Self-pay | Admitting: Obstetrics and Gynecology

## 2021-09-14 ENCOUNTER — Inpatient Hospital Stay (HOSPITAL_COMMUNITY): Payer: Medicaid Other

## 2021-09-14 DIAGNOSIS — I10 Essential (primary) hypertension: Secondary | ICD-10-CM | POA: Diagnosis present

## 2021-09-14 DIAGNOSIS — Z3A37 37 weeks gestation of pregnancy: Secondary | ICD-10-CM

## 2021-09-14 DIAGNOSIS — O1002 Pre-existing essential hypertension complicating childbirth: Principal | ICD-10-CM | POA: Diagnosis present

## 2021-09-14 DIAGNOSIS — O34219 Maternal care for unspecified type scar from previous cesarean delivery: Secondary | ICD-10-CM

## 2021-09-14 DIAGNOSIS — Z7982 Long term (current) use of aspirin: Secondary | ICD-10-CM | POA: Diagnosis not present

## 2021-09-14 DIAGNOSIS — O34211 Maternal care for low transverse scar from previous cesarean delivery: Secondary | ICD-10-CM

## 2021-09-14 DIAGNOSIS — O1092 Unspecified pre-existing hypertension complicating childbirth: Secondary | ICD-10-CM

## 2021-09-14 DIAGNOSIS — Z87891 Personal history of nicotine dependence: Secondary | ICD-10-CM | POA: Diagnosis not present

## 2021-09-14 DIAGNOSIS — O10919 Unspecified pre-existing hypertension complicating pregnancy, unspecified trimester: Principal | ICD-10-CM | POA: Diagnosis present

## 2021-09-14 DIAGNOSIS — O26893 Other specified pregnancy related conditions, third trimester: Secondary | ICD-10-CM | POA: Diagnosis present

## 2021-09-14 LAB — RPR: RPR Ser Ql: NONREACTIVE

## 2021-09-14 LAB — CBC
HCT: 34.9 % — ABNORMAL LOW (ref 36.0–46.0)
Hemoglobin: 11.8 g/dL — ABNORMAL LOW (ref 12.0–15.0)
MCH: 26.7 pg (ref 26.0–34.0)
MCHC: 33.8 g/dL (ref 30.0–36.0)
MCV: 79 fL — ABNORMAL LOW (ref 80.0–100.0)
Platelets: 241 10*3/uL (ref 150–400)
RBC: 4.42 MIL/uL (ref 3.87–5.11)
RDW: 14.9 % (ref 11.5–15.5)
WBC: 12.9 10*3/uL — ABNORMAL HIGH (ref 4.0–10.5)
nRBC: 0 % (ref 0.0–0.2)

## 2021-09-14 LAB — TYPE AND SCREEN
ABO/RH(D): O POS
Antibody Screen: NEGATIVE

## 2021-09-14 MED ORDER — TETANUS-DIPHTH-ACELL PERTUSSIS 5-2.5-18.5 LF-MCG/0.5 IM SUSY: 0.5000 mL | PREFILLED_SYRINGE | Freq: Once | INTRAMUSCULAR | Status: DC

## 2021-09-14 MED ORDER — DIPHENHYDRAMINE HCL 25 MG PO CAPS: 25.0000 mg | ORAL_CAPSULE | Freq: Four times a day (QID) | ORAL | Status: DC | PRN

## 2021-09-14 MED ORDER — LIDOCAINE HCL (PF) 1 % IJ SOLN
INTRAMUSCULAR | Status: DC | PRN
  Administered 2021-09-14: 6 mL via EPIDURAL
  Administered 2021-09-14: 4 mL via EPIDURAL

## 2021-09-14 MED ORDER — BENZOCAINE-MENTHOL 20-0.5 % EX AERO
1.0000 | INHALATION_SPRAY | CUTANEOUS | Status: DC | PRN
  Administered 2021-09-14: 1 via TOPICAL
  Filled 2021-09-14: qty 56

## 2021-09-14 MED ORDER — IBUPROFEN 600 MG PO TABS
600.0000 mg | ORAL_TABLET | Freq: Four times a day (QID) | ORAL | Status: DC
  Administered 2021-09-14 – 2021-09-15 (×4): 600 mg via ORAL
  Filled 2021-09-14 (×4): qty 1

## 2021-09-14 MED ORDER — ACETAMINOPHEN 325 MG PO TABS: 650.0000 mg | ORAL_TABLET | ORAL | Status: DC | PRN

## 2021-09-14 MED ORDER — HYDROXYZINE HCL 50 MG PO TABS: 50.0000 mg | ORAL_TABLET | Freq: Four times a day (QID) | ORAL | Status: DC | PRN

## 2021-09-14 MED ORDER — ACETAMINOPHEN 325 MG PO TABS
650.0000 mg | ORAL_TABLET | ORAL | Status: DC | PRN
  Administered 2021-09-15: 650 mg via ORAL
  Filled 2021-09-14: qty 2

## 2021-09-14 MED ORDER — DIBUCAINE (PERIANAL) 1 % EX OINT: 1.0000 | TOPICAL_OINTMENT | CUTANEOUS | Status: DC | PRN

## 2021-09-14 MED ORDER — OXYTOCIN-SODIUM CHLORIDE 30-0.9 UT/500ML-% IV SOLN
1.0000 m[IU]/min | INTRAVENOUS | Status: DC
  Administered 2021-09-14: 2 m[IU]/min via INTRAVENOUS
  Filled 2021-09-14: qty 500

## 2021-09-14 MED ORDER — COCONUT OIL OIL: 1.0000 | TOPICAL_OIL | Status: DC | PRN

## 2021-09-14 MED ORDER — LIDOCAINE HCL (PF) 1 % IJ SOLN: 30.0000 mL | INTRAMUSCULAR | Status: DC | PRN

## 2021-09-14 MED ORDER — SENNOSIDES-DOCUSATE SODIUM 8.6-50 MG PO TABS
2.0000 | ORAL_TABLET | ORAL | Status: DC
  Administered 2021-09-15: 2 via ORAL
  Filled 2021-09-14: qty 2

## 2021-09-14 MED ORDER — LACTATED RINGERS IV SOLN: 500.0000 mL | Freq: Once | INTRAVENOUS | Status: DC

## 2021-09-14 MED ORDER — LACTATED RINGERS IV SOLN
500.0000 mL | INTRAVENOUS | Status: DC | PRN
  Administered 2021-09-14 (×2): 500 mL via INTRAVENOUS

## 2021-09-14 MED ORDER — LABETALOL HCL 200 MG PO TABS
200.0000 mg | ORAL_TABLET | Freq: Two times a day (BID) | ORAL | Status: DC
  Administered 2021-09-14 – 2021-09-15 (×3): 200 mg via ORAL
  Filled 2021-09-14 (×3): qty 1

## 2021-09-14 MED ORDER — WITCH HAZEL-GLYCERIN EX PADS: 1.0000 | MEDICATED_PAD | CUTANEOUS | Status: DC | PRN

## 2021-09-14 MED ORDER — LACTATED RINGERS AMNIOINFUSION
INTRAVENOUS | Status: DC
  Administered 2021-09-14: 150 mL/h via INTRAUTERINE

## 2021-09-14 MED ORDER — OXYTOCIN BOLUS FROM INFUSION
333.0000 mL | Freq: Once | INTRAVENOUS | Status: AC
  Administered 2021-09-14: 333 mL via INTRAVENOUS

## 2021-09-14 MED ORDER — EPHEDRINE 5 MG/ML INJ: 10.0000 mg | INTRAVENOUS | Status: DC | PRN

## 2021-09-14 MED ORDER — FENTANYL CITRATE (PF) 100 MCG/2ML IJ SOLN: 100.0000 ug | INTRAMUSCULAR | Status: DC | PRN

## 2021-09-14 MED ORDER — DIPHENHYDRAMINE HCL 50 MG/ML IJ SOLN: 12.5000 mg | INTRAMUSCULAR | Status: DC | PRN

## 2021-09-14 MED ORDER — OXYTOCIN-SODIUM CHLORIDE 30-0.9 UT/500ML-% IV SOLN: 2.5000 [IU]/h | INTRAVENOUS | Status: DC

## 2021-09-14 MED ORDER — ONDANSETRON HCL 4 MG/2ML IJ SOLN: 4.0000 mg | Freq: Four times a day (QID) | INTRAMUSCULAR | Status: DC | PRN

## 2021-09-14 MED ORDER — FENTANYL-BUPIVACAINE-NACL 0.5-0.125-0.9 MG/250ML-% EP SOLN
12.0000 mL/h | EPIDURAL | Status: DC | PRN
  Administered 2021-09-14: 12 mL/h via EPIDURAL
  Filled 2021-09-14: qty 250

## 2021-09-14 MED ORDER — FLEET ENEMA 7-19 GM/118ML RE ENEM: 1.0000 | ENEMA | RECTAL | Status: DC | PRN

## 2021-09-14 MED ORDER — TERBUTALINE SULFATE 1 MG/ML IJ SOLN
0.2500 mg | Freq: Once | INTRAMUSCULAR | Status: AC | PRN
  Administered 2021-09-14: 0.25 mg via SUBCUTANEOUS
  Filled 2021-09-14: qty 1

## 2021-09-14 MED ORDER — OXYCODONE-ACETAMINOPHEN 5-325 MG PO TABS: 2.0000 | ORAL_TABLET | ORAL | Status: DC | PRN

## 2021-09-14 MED ORDER — ONDANSETRON HCL 4 MG/2ML IJ SOLN: 4.0000 mg | INTRAMUSCULAR | Status: DC | PRN

## 2021-09-14 MED ORDER — OXYCODONE-ACETAMINOPHEN 5-325 MG PO TABS: 1.0000 | ORAL_TABLET | ORAL | Status: DC | PRN

## 2021-09-14 MED ORDER — LACTATED RINGERS IV SOLN
INTRAVENOUS | Status: DC
  Administered 2021-09-14: 125 mL/h via INTRAVENOUS

## 2021-09-14 MED ORDER — PRENATAL MULTIVITAMIN CH
1.0000 | ORAL_TABLET | Freq: Every day | ORAL | Status: DC
  Administered 2021-09-15: 1 via ORAL
  Filled 2021-09-14: qty 1

## 2021-09-14 MED ORDER — ZOLPIDEM TARTRATE 5 MG PO TABS: 5.0000 mg | ORAL_TABLET | Freq: Every evening | ORAL | Status: DC | PRN

## 2021-09-14 MED ORDER — SOD CITRATE-CITRIC ACID 500-334 MG/5ML PO SOLN: 30.0000 mL | ORAL | Status: DC | PRN

## 2021-09-14 MED ORDER — ONDANSETRON HCL 4 MG PO TABS: 4.0000 mg | ORAL_TABLET | ORAL | Status: DC | PRN

## 2021-09-14 MED ORDER — SIMETHICONE 80 MG PO CHEW: 80.0000 mg | CHEWABLE_TABLET | ORAL | Status: DC | PRN

## 2021-09-14 MED ORDER — PHENYLEPHRINE 80 MCG/ML (10ML) SYRINGE FOR IV PUSH (FOR BLOOD PRESSURE SUPPORT)
80.0000 ug | PREFILLED_SYRINGE | INTRAVENOUS | Status: DC | PRN
  Filled 2021-09-14: qty 10

## 2021-09-14 MED ORDER — PHENYLEPHRINE 80 MCG/ML (10ML) SYRINGE FOR IV PUSH (FOR BLOOD PRESSURE SUPPORT): 80.0000 ug | PREFILLED_SYRINGE | INTRAVENOUS | Status: DC | PRN

## 2021-09-14 NOTE — H&P (Signed)
OBSTETRIC ADMISSION HISTORY AND PHYSICAL  Debra Glass is a 36 y.o. female [phone] with IUP at [redacted]w[redacted]d by LMP presenting for IOL TOLAC. She reports +FMs, No LOF, no VB, no blurry vision, headaches or peripheral edema, and RUQ pain.  She plans on formula feeding. She request depo for birth control, plans on interval BTL. She received her prenatal care at [facility]   Dating: By LMP --->  Estimated Date of Delivery: 09/30/21  Sono:    @[redacted]w[redacted]d , CWD, normal anatomy, cephalic presentation, posterior placenta, 3466g, 80% EFW   Prenatal History/Complications: Hx preterm birth x2 (35 wk and 36 wk), Hx gHTN, Hx CS @ 31 wks for complete abruption followed by DIC  Past Medical History: Past Medical History:  Diagnosis Date   Pregnancy induced hypertension     Past Surgical History: Past Surgical History:  Procedure Laterality Date   CESAREAN SECTION N/A 06/18/2020   Procedure: CESAREAN SECTION;  Surgeon: 06/20/2020, DO;  Location: [facility];  Service: Obstetrics;  Laterality: N/A;   DILATION AND CURETTAGE OF UTERUS      Obstetrical History: OB History     Gravida  5   Para  3   Term      Preterm  3   AB  1   Living  2      SAB  1   IAB      Ectopic      Multiple  0   Live Births  2           Social History Social History   Socioeconomic History   Marital status: Single    Spouse name: Not on file   Number of children: Not on file   Years of education: Not on file   Highest education level: Not on file  Occupational History   Not on file  Tobacco Use   Smoking status: Former    Types: Cigarettes   Smokeless tobacco: Never  Vaping Use   Vaping Use: Never used  Substance and Sexual Activity   Alcohol use: Not Currently    Comment: occasionally   Drug use: No   Sexual activity: Yes    Birth control/protection: None  Other Topics Concern   Not on file  Social History Narrative   Not on file   Social Determinants of Health   Financial  Resource Strain: Low Risk  (07/11/2021)   Overall Financial Resource Strain (CARDIA)    Difficulty of Paying Living Expenses: Not hard at all  Food Insecurity: No Food Insecurity (07/11/2021)   Hunger Vital Sign    Worried About Running Out of Food in the Last Year: Never true    Ran Out of Food in the Last Year: Never true  Transportation Needs: No Transportation Needs (07/11/2021)   PRAPARE - 09/11/2021 (Medical): No    Lack of Transportation (Non-Medical): No  Physical Activity: Sufficiently Active (07/11/2021)   Exercise Vital Sign    Days of Exercise per Week: 5 days    Minutes of Exercise per Session: 100 min  Stress: No Stress Concern Present (07/11/2021)   09/11/2021 of Occupational Health - Occupational Stress Questionnaire    Feeling of Stress : Not at all  Social Connections: Moderately Isolated (07/11/2021)   Social Connection and Isolation Panel [NHANES]    Frequency of Communication with Friends and Family: Twice a week    Frequency of Social Gatherings with Friends and Family: Once a week  Attends Religious Services: Never    Active Member of Clubs or Organizations: No    Attends Banker Meetings: Never    Marital Status: Living with partner    Family History: Family History  Problem Relation Age of Onset   Heart disease Maternal Grandmother        had a pacemaker   Cancer Maternal Grandfather     Allergies: No Known Allergies  Medications Prior to Admission  Medication Sig Dispense Refill Last Dose   aspirin 81 MG EC tablet Take 2 tablets (162 mg total) by mouth daily. Swallow whole. 180 tablet 2    Blood Pressure Monitor MISC For regular home bp monitoring during pregnancy 1 each 0    labetalol (NORMODYNE) 100 MG tablet TAKE 2 TABLETS(200 MG) BY MOUTH TWICE DAILY 60 tablet 2    omeprazole (PRILOSEC OTC) 20 MG tablet Take 1 tablet (20 mg total) by mouth 2 (two) times daily. 90 tablet 1    Prenatal Vit-Fe  Fumarate-FA (PRENATAL PO) Take by mouth.        Review of Systems   All systems reviewed and negative except as stated in HPI  Blood pressure 137/81, pulse 83, temperature 98.7 F (37.1 C), temperature source Oral, resp. rate 16, height 5\' 6"  (1.676 m), weight 83.1 kg, last menstrual period 12/24/2020. General appearance: cooperative, appears stated age, and mild distress Lungs: Normal work of breathing  Heart: regular rate  Abdomen: soft, non-tender; bowel sounds normal Extremities: Homans sign is negative, no sign of DVT Presentation: cephalic Fetal monitoringBaseline: 135 bpm, Variability: Good {> 6 bpm), Accelerations: Reactive, and Decelerations: Absent Uterine activityFrequency: Every 2-3 minutes Dilation: 3 Effacement (%): 80 Station: -1 Exam by:: [email or web address] RN   Prenatal labs: ABO, Rh: --/--/PENDING (09/13 05-22-1991) Antibody: PENDING (09/13 0947) Rubella: 2.45 (03/17 1226) RPR: Non Reactive (07/10 0838)  HBsAg: Negative (03/17 1226)  HIV: Non Reactive (07/10 0838)  GBS: Negative/-- (08/28 1620)  1 hr Glucola Normal  Genetic screening  Normal Anatomy 03-15-1973 normal   Prenatal Transfer Tool  Maternal Diabetes: No Genetic Screening: Normal Maternal Ultrasounds/Referrals: Normal Fetal Ultrasounds or other Referrals:  None Maternal Substance Abuse:  No Significant Maternal Medications:  Meds include: Other:  Labetalol 200 mg BID Significant Maternal Lab Results:  Group B Strep negative Number of Prenatal Visits:greater than 3 verified prenatal visits Other Comments:  None  Results for orders placed or performed during the hospital encounter of 09/14/21 (from the past 24 hour(s))  Type and screen   Collection Time: 09/14/21  9:47 AM  Result Value Ref Range   ABO/RH(D) PENDING    Antibody Screen PENDING    Sample Expiration      09/17/2021,2359 Performed at [facility], [address] [place], [place]   Results for orders placed or performed in  visit on 09/13/21 (from the past 24 hour(s))  POC Urinalysis Dipstick OB   Collection Time: 09/13/21  3:28 PM  Result Value Ref Range   Color, UA     Clarity, UA     Glucose, UA Negative Negative   Bilirubin, UA     Ketones, UA neg    Spec Grav, UA     Blood, UA neg    pH, UA     POC,PROTEIN,UA Negative Negative, Trace, Small (1+), Moderate (2+), Large (3+), 4+   Urobilinogen, UA     Nitrite, UA neg    Leukocytes, UA Negative Negative   Appearance  Odor      Patient Active Problem List   Diagnosis Date Noted   Chronic hypertension affecting pregnancy 09/14/2021   Maternal chronic hypertension in third trimester 08/08/2021   Supervision of high risk pregnancy, antepartum 03/17/2021   History of cesarean section 07/23/2020   History of IUFD 06/17/2020   History of prior pregnancy with IUGR newborn 06/10/2020   History of gestational hypertension & PPHTN 05/26/2020   Abnormal chromosomal and genetic finding on antenatal screening mother 03/09/2020   History of preterm delivery 02/13/2020    Assessment/Plan:  TAIA BRAMLETT is a 36 y.o. [id number] at [redacted]w[redacted]d here forIOL for gHTN, TOLAC.   #Labor:Initiating induction. Favorable cervix, pitocin started. Consider AROM at next check/  #Pain: PRN epidural  #FWB: Cat 1 #ID:  GBS neg  #MOF: Formula #MOC:Depo > interval BTL  #cHTN: no severe range today. Continue home Labetalol 200 mg BID. Monitor for signs of preeclampsia   Celedonio Savage, MD  09/14/2021, 10:17 AM

## 2021-09-14 NOTE — Anesthesia Procedure Notes (Signed)
Epidural  Start time: 09/14/2021 12:43 PM End time: 09/14/2021 12:52 PM  Needle:  Needle insertion depth: 5 cm Catheter at skin depth: 10 cm Test dose: negative

## 2021-09-14 NOTE — Anesthesia Preprocedure Evaluation (Signed)
Anesthesia Evaluation  Patient identified by MRN, date of birth, ID band Patient awake    Reviewed: Allergy & Precautions, H&P , NPO status , Patient's Chart, lab work & pertinent test results  History of Anesthesia Complications Negative for: history of anesthetic complications  Airway Mallampati: II  TM Distance: >3 FB     Dental   Pulmonary neg pulmonary ROS, former smoker,    Pulmonary exam normal        Cardiovascular hypertension,  Rhythm:regular Rate:Normal     Neuro/Psych negative neurological ROS  negative psych ROS   GI/Hepatic negative GI ROS, Neg liver ROS,   Endo/Other  negative endocrine ROS  Renal/GU negative Renal ROS  negative genitourinary   Musculoskeletal   Abdominal   Peds  Hematology negative hematology ROS (+)   Anesthesia Other Findings   Reproductive/Obstetrics (+) Pregnancy                             Anesthesia Physical Anesthesia Plan  ASA: 2  Anesthesia Plan: Epidural   Post-op Pain Management:    Induction:   PONV Risk Score and Plan:   Airway Management Planned:   Additional Equipment:   Intra-op Plan:   Post-operative Plan:   Informed Consent: I have reviewed the patients History and Physical, chart, labs and discussed the procedure including the risks, benefits and alternatives for the proposed anesthesia with the patient or authorized representative who has indicated his/her understanding and acceptance.       Plan Discussed with:   Anesthesia Plan Comments:         Anesthesia Quick Evaluation

## 2021-09-14 NOTE — Lactation Note (Signed)
This note was copied from a baby's chart. Lactation Consultation Note  Patient Name: Debra Glass Today's Date: 09/14/2021   Age:36 hours Choice of feeding is formula. Maternal Data    Feeding    LATCH Score                    Lactation Tools Discussed/Used    Interventions    Discharge    Consult Status Consult Status: Complete    Lynia Landry G 09/14/2021, 7:13 PM

## 2021-09-14 NOTE — Progress Notes (Signed)
LABOR PROGRESS NOTE  Debra Glass is a 36 y.o. [id number] at [redacted]w[redacted]d  admitted for IOL for cHTN, TOLAC, Hx of IUFD in third trimester.   Subjective: Comfortable with epidural   Objective: BP 119/61   Pulse 65   Temp 98.4 F (36.9 C) (Oral)   Resp 16   Ht 5\' 6"  (1.676 m)   Wt 83.1 kg   LMP 12/24/2020   SpO2 99%   BMI 29.57 kg/m  or  Vitals:   09/14/21 1310 09/14/21 1315 09/14/21 1330 09/14/21 1400  BP: 120/67 117/64 109/66 119/61  Pulse: 69 68 66 65  Resp: 16 16 16 16   Temp:      TempSrc:      SpO2: 100% 100% 100% 99%  Weight:      Height:         Dilation: 5 Effacement (%): 90 Cervical Position: Posterior Station: -1 Presentation: Vertex Exam by:: MD Crensenzo FHT: baseline rate 120, moderate varibility, + acel, no decel Toco: every 2-3 min  Labs: Lab Results  Component Value Date   WBC 12.9 (H) 09/14/2021   HGB 11.8 (L) 09/14/2021   HCT 34.9 (L) 09/14/2021   MCV 79.0 (L) 09/14/2021   PLT 241 09/14/2021    Patient Active Problem List   Diagnosis Date Noted   Chronic hypertension affecting pregnancy 09/14/2021   Maternal chronic hypertension in third trimester 08/08/2021   Supervision of high risk pregnancy, antepartum 03/17/2021   History of cesarean section 07/23/2020   History of IUFD 06/17/2020   History of prior pregnancy with IUGR newborn 06/10/2020   History of gestational hypertension & PPHTN 05/26/2020   Abnormal chromosomal and genetic finding on antenatal screening mother 03/09/2020   History of preterm delivery 02/13/2020    Assessment / Plan: 36 y.o. 04/12/2020 at [redacted]w[redacted]d here for IOL for cHTN, TOLAC   Labor: Progressing well. Pitocin running. AROM at this check with clear fluid  Fetal Wellbeing:  Cat 1 Pain Control:  epidural in place  Anticipated MOD:  vaginal delivery   [id number], MD  09/14/2021, 2:26 PM

## 2021-09-14 NOTE — Discharge Summary (Signed)
Postpartum Discharge Summary  Date of Service updated- yes     Patient Name: Debra Glass DOB: Apr 02, 1985 MRN: [id number]  Date of admission: 09/14/2021 Delivery date:09/14/2021  Delivering provider: Concepcion Living  Date of discharge: 09/15/2021  Admitting diagnosis: Chronic hypertension affecting pregnancy [O10.919] Intrauterine pregnancy: [redacted]w[redacted]d    Secondary diagnosis:  Principal Problem:   Chronic hypertension affecting pregnancy Active Problems:   VBAC, delivered  Additional problems: none    Discharge diagnosis: Term Pregnancy Delivered, VBAC, and CHTN                                              Post partum procedures: none Augmentation: AROM and Pitocin Complications: None  Hospital course: Induction of Labor With Vaginal Delivery   36y.o. yo [id number] 380w5das admitted to the hospital 09/14/2021 for induction of labor.  Indication for induction:  chronic HTN, TOLAC, Hx third trimester IUFD .  Patient had an uncomplicated labor course as follows: Membrane Rupture Time/Date: 2:18 PM ,09/14/2021   Delivery Method:VBAC, Spontaneous  Episiotomy: None  Lacerations:  Labial;Periurethral  Details of delivery can be found in separate delivery note.  Patient had a routine postpartum course. Patient is discharged home 09/15/21.  Newborn Data: Birth date:09/14/2021  Birth time:4:59 PM  Gender:Female  Living status:Living  Apgars:9 ,9  Weight:3120 g   Magnesium Sulfate received: No BMZ received: No Rhophylac:N/A MMR:N/A, rubella immune T-DaP:Given prenatally Flu: N/A Transfusion:No  Physical exam  Vitals:   09/14/21 2000 09/14/21 2100 09/15/21 0130 09/15/21 0530  BP: (!) 132/92 128/77 (!) 106/59 123/66  Pulse: 66 70 78 62  Resp: _0 Temp: 98.7 F (37.1 C) 98.6 F (37 C) 98.4 F (36.9 C) 98.3 F (36.8 C)  TempSrc: Oral Oral Oral Oral  SpO2: 100% 100% 96% 99%  Weight:      Height:       General: alert, cooperative, and no  distress Lochia: appropriate Uterine Fundus: firm Incision: N/A DVT Evaluation: No evidence of DVT seen on physical exam. Labs: Lab Results  Component Value Date   WBC 12.9 (H) 09/14/2021   HGB 11.8 (L) 09/14/2021   HCT 34.9 (L) 09/14/2021   MCV 79.0 (L) 09/14/2021   PLT 241 09/14/2021      Latest Ref Rng & Units 03/18/2021   12:26 PM  CMP  Glucose 70 - 99 mg/dL 67   BUN 6 - 20 mg/dL 7   Creatinine 0.57 - 1.00 mg/dL 0.65   Sodium 134 - 144 mmol/L 135   Potassium 3.5 - 5.2 mmol/L 4.1   Chloride 96 - 106 mmol/L 102   CO2 20 - 29 mmol/L 19   Calcium 8.7 - 10.2 mg/dL 9.1   Total Protein 6.0 - 8.5 g/dL 6.7   Total Bilirubin 0.0 - 1.2 mg/dL 0.3   Alkaline Phos 44 - 121 IU/L 62   AST 0 - 40 IU/L 12   ALT 0 - 32 IU/L 10    Edinburgh Score:    09/14/2021    9:00 PM  Edinburgh Postnatal Depression Scale Screening Tool  I have been able to laugh and see the funny side of things. 0  I have looked forward with enjoyment to things. 0  I have blamed myself unnecessarily when things went wrong. 0  I have been anxious or worried for  no good reason. 1  I have felt scared or panicky for no good reason. 0  Things have been getting on top of me. 0  I have been so unhappy that I have had difficulty sleeping. 0  I have felt sad or miserable. 0  I have been so unhappy that I have been crying. 0  The thought of harming myself has occurred to me. 0  Edinburgh Postnatal Depression Scale Total 1     After visit meds:  Allergies as of 09/15/2021   No Known Allergies      Medication List     STOP taking these medications    aspirin EC 81 MG tablet   omeprazole 20 MG tablet Commonly known as: PriLOSEC OTC       TAKE these medications    Blood Pressure Monitor Misc For regular home bp monitoring during pregnancy   furosemide 20 MG tablet Commonly known as: Lasix Take 1 tablet (20 mg total) by mouth daily for 5 days.   ibuprofen 600 MG tablet Commonly known as: ADVIL Take  1 tablet (600 mg total) by mouth every 6 (six) hours.   labetalol 100 MG tablet Commonly known as: NORMODYNE TAKE 2 TABLETS(200 MG) BY MOUTH TWICE DAILY   PRENATAL PO Take by mouth.         Discharge home in stable condition Infant Feeding: Bottle Infant Disposition:home with mother Discharge instruction: per After Visit Summary and Postpartum booklet. Activity: Advance as tolerated. Pelvic rest for 6 weeks.  Diet: low salt diet Future Appointments: Future Appointments  Date Time Provider [place]  09/21/2021  9:50 AM [facility] NURSE [facility]  10/19/2021  9:10 AM Myrtis Ser, CNM [facility]   Follow up Visit:  Message sent 09/14/21   Please schedule this patient for a In person postpartum visit in 4 weeks with the following provider: Any provider. Additional Postpartum F/U:BP check 1 week  High risk pregnancy complicated by: HTN Delivery mode:  VBAC, Spontaneous  Anticipated Birth Control:  Depo and interval tubal ligation    Liliane Channel MD MPH OB Fellow, [place] for [facility] 09/15/2021

## 2021-09-15 ENCOUNTER — Other Ambulatory Visit: Payer: Medicaid Other

## 2021-09-15 ENCOUNTER — Encounter: Payer: Medicaid Other | Admitting: Advanced Practice Midwife

## 2021-09-15 ENCOUNTER — Ambulatory Visit: Payer: Medicaid Other

## 2021-09-15 ENCOUNTER — Other Ambulatory Visit (HOSPITAL_COMMUNITY): Payer: Self-pay

## 2021-09-15 ENCOUNTER — Encounter (HOSPITAL_COMMUNITY): Payer: Self-pay | Admitting: Obstetrics and Gynecology

## 2021-09-15 MED ORDER — IBUPROFEN 600 MG PO TABS
600.0000 mg | ORAL_TABLET | Freq: Four times a day (QID) | ORAL | 0 refills | Status: DC
  Filled 2021-09-15: qty 30, 8d supply, fill #0

## 2021-09-15 MED ORDER — FUROSEMIDE 20 MG PO TABS
20.0000 mg | ORAL_TABLET | Freq: Every day | ORAL | 0 refills | Status: DC
  Filled 2021-09-15: qty 5, 5d supply, fill #0

## 2021-09-15 NOTE — Anesthesia Postprocedure Evaluation (Signed)
Anesthesia Post Note  Patient: Michaele Offer  Procedure(s) Performed: AN AD HOC LABOR EPIDURAL     Patient location during evaluation: Mother Baby Anesthesia Type: Epidural Level of consciousness: awake, oriented and awake and alert Pain management: pain level controlled Vital Signs Assessment: post-procedure vital signs reviewed and stable Respiratory status: spontaneous breathing, respiratory function stable and nonlabored ventilation Cardiovascular status: stable Postop Assessment: no headache, adequate PO intake, able to ambulate, patient able to bend at knees and no apparent nausea or vomiting Anesthetic complications: no   No notable events documented.  Last Vitals:  Vitals:   09/15/21 0130 09/15/21 0530  BP: (!) 106/59 123/66  Pulse: 78 62  Resp: 20 20  Temp: 36.9 C 36.8 C  SpO2: 96% 99%    Last Pain:  Vitals:   09/15/21 0530  TempSrc: Oral  PainSc: 4    Pain Goal:                   Andranik Jeune

## 2021-09-15 NOTE — Discharge Instructions (Signed)
1.  Follow-up outpatient in 1 week for BP check and then 4 to 6 weeks, as scheduled for your postpartum visit.    2. Please check blood pressure at home once a day. Restart labetalol if numbers are persistently above 130/85  4.  Please call if you start to experience increased vaginal bleeding requiring a change of pads every 2 hours, sudden onset severe headache, right abdominal pain, flashes of light in a vision or any concerns for new elevated blood pressure.

## 2021-09-16 LAB — SURGICAL PATHOLOGY

## 2021-09-19 ENCOUNTER — Encounter: Payer: Medicaid Other | Admitting: Women's Health

## 2021-09-19 ENCOUNTER — Other Ambulatory Visit: Payer: Medicaid Other

## 2021-09-20 ENCOUNTER — Encounter (HOSPITAL_COMMUNITY): Payer: Self-pay | Admitting: Emergency Medicine

## 2021-09-20 ENCOUNTER — Other Ambulatory Visit: Payer: Self-pay

## 2021-09-20 ENCOUNTER — Observation Stay (HOSPITAL_COMMUNITY)
Admission: AD | Admit: 2021-09-20 | Discharge: 2021-09-22 | Disposition: A | Discharge status: Home / Self Care | Payer: Medicaid Other | Attending: Obstetrics and Gynecology | Admitting: Obstetrics and Gynecology

## 2021-09-20 DIAGNOSIS — O9089 Other complications of the puerperium, not elsewhere classified: Secondary | ICD-10-CM

## 2021-09-20 DIAGNOSIS — Z87891 Personal history of nicotine dependence: Secondary | ICD-10-CM | POA: Insufficient documentation

## 2021-09-20 DIAGNOSIS — Z98891 History of uterine scar from previous surgery: Secondary | ICD-10-CM | POA: Insufficient documentation

## 2021-09-20 DIAGNOSIS — Z79899 Other long term (current) drug therapy: Secondary | ICD-10-CM | POA: Diagnosis not present

## 2021-09-20 DIAGNOSIS — O1003 Pre-existing essential hypertension complicating the puerperium: Secondary | ICD-10-CM | POA: Diagnosis present

## 2021-09-20 DIAGNOSIS — O1490 Unspecified pre-eclampsia, unspecified trimester: Secondary | ICD-10-CM

## 2021-09-20 DIAGNOSIS — O1415 Severe pre-eclampsia, complicating the puerperium: Principal | ICD-10-CM | POA: Insufficient documentation

## 2021-09-20 LAB — URINALYSIS, ROUTINE W REFLEX MICROSCOPIC
Bilirubin Urine: NEGATIVE
Glucose, UA: NEGATIVE mg/dL
Ketones, ur: 5 mg/dL — AB
Nitrite: NEGATIVE
Protein, ur: 30 mg/dL — AB
Specific Gravity, Urine: 1.008 (ref 1.005–1.030)
pH: 7 (ref 5.0–8.0)

## 2021-09-20 LAB — CBC WITH DIFFERENTIAL/PLATELET
Abs Immature Granulocytes: 0.02 10*3/uL (ref 0.00–0.07)
Basophils Absolute: 0 10*3/uL (ref 0.0–0.1)
Basophils Relative: 1 %
Eosinophils Absolute: 0.3 10*3/uL (ref 0.0–0.5)
Eosinophils Relative: 4 %
HCT: 37.5 % (ref 36.0–46.0)
Hemoglobin: 12.5 g/dL (ref 12.0–15.0)
Immature Granulocytes: 0 %
Lymphocytes Relative: 43 %
Lymphs Abs: 2.7 10*3/uL (ref 0.7–4.0)
MCH: 26.6 pg (ref 26.0–34.0)
MCHC: 33.3 g/dL (ref 30.0–36.0)
MCV: 79.8 fL — ABNORMAL LOW (ref 80.0–100.0)
Monocytes Absolute: 0.4 10*3/uL (ref 0.1–1.0)
Monocytes Relative: 7 %
Neutro Abs: 2.8 10*3/uL (ref 1.7–7.7)
Neutrophils Relative %: 45 %
Platelets: 276 10*3/uL (ref 150–400)
RBC: 4.7 MIL/uL (ref 3.87–5.11)
RDW: 14.6 % (ref 11.5–15.5)
WBC: 6.2 10*3/uL (ref 4.0–10.5)
nRBC: 0 % (ref 0.0–0.2)

## 2021-09-20 LAB — COMPREHENSIVE METABOLIC PANEL
ALT: 32 U/L (ref 0–44)
AST: 24 U/L (ref 15–41)
Albumin: 3.1 g/dL — ABNORMAL LOW (ref 3.5–5.0)
Alkaline Phosphatase: 114 U/L (ref 38–126)
Anion gap: 9 (ref 5–15)
BUN: 14 mg/dL (ref 6–20)
CO2: 20 mmol/L — ABNORMAL LOW (ref 22–32)
Calcium: 8.8 mg/dL — ABNORMAL LOW (ref 8.9–10.3)
Chloride: 109 mmol/L (ref 98–111)
Creatinine, Ser: 0.89 mg/dL (ref 0.44–1.00)
GFR, Estimated: >60 mL/min (ref >60)
Glucose, Bld: 86 mg/dL (ref 70–99)
Potassium: 3.6 mmol/L (ref 3.5–5.1)
Sodium: 138 mmol/L (ref 135–145)
Total Bilirubin: 0.6 mg/dL (ref 0.3–1.2)
Total Protein: 6.4 g/dL — ABNORMAL LOW (ref 6.5–8.1)

## 2021-09-20 LAB — PROTIME-INR
INR: 1 (ref 0.8–1.2)
Prothrombin Time: 12.6 seconds (ref 11.4–15.2)

## 2021-09-20 MED ORDER — ACETAMINOPHEN 500 MG PO TABS
1000.0000 mg | ORAL_TABLET | Freq: Once | ORAL | Status: AC
  Administered 2021-09-20: 1000 mg via ORAL
  Filled 2021-09-20: qty 2

## 2021-09-20 MED ORDER — MAGNESIUM SULFATE 2 GM/50ML IV SOLN
2.0000 g | Freq: Once | INTRAVENOUS | Status: AC
  Administered 2021-09-20: 2 g via INTRAVENOUS
  Filled 2021-09-20: qty 50

## 2021-09-20 MED ORDER — HYDRALAZINE HCL 20 MG/ML IJ SOLN
10.0000 mg | INTRAMUSCULAR | Status: AC
  Administered 2021-09-20: 10 mg via INTRAVENOUS
  Filled 2021-09-20: qty 1

## 2021-09-20 MED ORDER — SODIUM CHLORIDE 0.9 % IV SOLN: INTRAVENOUS | Status: DC

## 2021-09-20 NOTE — ED Provider Notes (Signed)
[facility] EMERGENCY DEPARTMENT Provider Note   CSN: [id number] Arrival date & time: 09/20/21  2042     History  Chief Complaint  Patient presents with   Hypertension    Debra Glass is a 36 y.o. female.   Hypertension   This patient is a very pleasant 36 year old female who delivered her fourth child approximately 5 days ago by vaginal birth.  During the last year and a half or so the patient has been pregnant and had a stillborn and then got pregnant again and had a successful delivery.  That being said during these pregnancies the patient was diagnosed with pregnancy related hypertension or even preeclampsia.  She reports that even through that time being on labetalol she had blood pressures that were around 130/80 very consistently.  When she was discharged from the hospital several days ago she was told that if the blood pressure became more elevated she would need to come back to the ER.  Today she noted that she was starting to have some headache, for this reason she decided to come get evaluated when she found her blood pressure to be 190/110 at home.  She has no frequency of urine, no abdominal pain other than the occasional cramping, no back pain no chest pain no shortness of breath no blurred vision no double vision and no swelling of the legs.  She was never really formally diagnosed with preeclampsia or eclampsia    Home Medications Prior to Admission medications   Medication Sig Start Date End Date Taking? Authorizing Provider  furosemide (LASIX) 20 MG tablet Take 1 tablet (20 mg total) by mouth daily for 5 days. 09/15/21 09/20/21  Ndulue, Chiagoziem J, MD  ibuprofen (ADVIL) 600 MG tablet Take 1 tablet (600 mg total) by mouth every 6 (six) hours. 09/15/21   Ndulue, Chiagoziem J, MD  labetalol (NORMODYNE) 100 MG tablet TAKE 2 TABLETS(200 MG) BY MOUTH TWICE DAILY Patient taking differently: Take 100 mg by mouth 2 (two) times daily. 08/16/21   Florian Buff, MD   Prenatal Vit-Fe Fumarate-FA (PRENATAL PO) Take 2 tablets by mouth daily.    [provider]      Allergies    Patient has no known allergies.    Review of Systems   Review of Systems  All other systems reviewed and are negative.   Physical Exam Updated Vital Signs BP (!) 171/89   Pulse (!) 56   Temp 98.3 F (36.8 C) (Oral)   Resp 13   Ht 1.676 m (5\' 6" )   Wt 74.8 kg   SpO2 100%   BMI 26.63 kg/m  Physical Exam Vitals and nursing note reviewed.  Constitutional:      General: She is not in acute distress.    Appearance: She is well-developed.  HENT:     Head: Normocephalic and atraumatic.     Mouth/Throat:     Pharynx: No oropharyngeal exudate.  Eyes:     General: No scleral icterus.       Right eye: No discharge.        Left eye: No discharge.     Conjunctiva/sclera: Conjunctivae normal.     Pupils: Pupils are equal, round, and reactive to light.  Neck:     Thyroid: No thyromegaly.     Vascular: No JVD.  Cardiovascular:     Rate and Rhythm: Normal rate and regular rhythm.     Heart sounds: Normal heart sounds. No murmur heard.    No friction  rub. No gallop.  Pulmonary:     Effort: Pulmonary effort is normal. No respiratory distress.     Breath sounds: Normal breath sounds. No wheezing or rales.  Abdominal:     General: Bowel sounds are normal. There is no distension.     Palpations: Abdomen is soft. There is no mass.     Tenderness: There is no abdominal tenderness.  Musculoskeletal:        General: No tenderness. Normal range of motion.     Cervical back: Normal range of motion and neck supple.     Right lower leg: No edema.     Left lower leg: No edema.  Lymphadenopathy:     Cervical: No cervical adenopathy.  Skin:    General: Skin is warm and dry.     Findings: No erythema or rash.  Neurological:     Mental Status: She is alert.     Coordination: Coordination normal.     Comments: Normal speech, coordination, cranial nerves III through XII  are normal strength in all 4 extremities  Psychiatric:        Behavior: Behavior normal.     ED Results / Procedures / Treatments   Labs (all labs ordered are listed, but only abnormal results are displayed) Labs Reviewed  CBC WITH DIFFERENTIAL/PLATELET - Abnormal; Notable for the following components:      Result Value   MCV 79.8 (*)    All other components within normal limits  COMPREHENSIVE METABOLIC PANEL - Abnormal; Notable for the following components:   CO2 20 (*)    Calcium 8.8 (*)    Total Protein 6.4 (*)    Albumin 3.1 (*)    All other components within normal limits  URINALYSIS, ROUTINE W REFLEX MICROSCOPIC - Abnormal; Notable for the following components:   APPearance HAZY (*)    Hgb urine dipstick LARGE (*)    Ketones, ur 5 (*)    Protein, ur 30 (*)    Leukocytes,Ua MODERATE (*)    Bacteria, UA RARE (*)    All other components within normal limits  PROTIME-INR    EKG EKG Interpretation  Date/Time:  Tuesday September 20 2021 20:58:47 EDT Ventricular Rate:  52 PR Interval:  105 QRS Duration: 75 QT Interval:  444 QTC Calculation: 413 R Axis:   -14 Text Interpretation: Sinus rhythm Ventricular premature complex Short PR interval Low voltage, precordial leads Baseline wander in lead(s) II III aVF Confirmed by Eber Hong ([id number]) on 09/20/2021 9:19:27 PM  Radiology No results found.  Procedures .Critical Care  Performed by: Eber Hong, MD Authorized by: Eber Hong, MD   Critical care provider statement:    Critical care time (minutes):  30   Critical care time was exclusive of:  Separately billable procedures and treating other patients and teaching time   Critical care was necessary to treat or prevent imminent or life-threatening deterioration of the following conditions: Preeclampsia, severe hypertension.   Critical care was time spent personally by me on the following activities:  Development of treatment plan with patient or surrogate,  discussions with consultants, evaluation of patient's response to treatment, examination of patient, ordering and review of laboratory studies, ordering and review of radiographic studies, ordering and performing treatments and interventions, pulse oximetry, re-evaluation of patient's condition and review of old charts   I assumed direction of critical care for this patient from another provider in my specialty: no     Care discussed with: admitting provider   Comments:  Medications Ordered in ED Medications  0.9 %  sodium chloride infusion ( Intravenous New Bag/Given 09/20/21 2116)  hydrALAZINE (APRESOLINE) injection 10 mg (10 mg Intravenous Given 09/20/21 2121)  acetaminophen (TYLENOL) tablet 1,000 mg (1,000 mg Oral Given 09/20/21 2119)  magnesium sulfate IVPB 2 g 50 mL (2 g Intravenous New Bag/Given 09/20/21 2117)    ED Course/ Medical Decision Making/ A&P                           Medical Decision Making Amount and/or Complexity of Data Reviewed Labs: ordered.  Risk OTC drugs. Prescription drug management. Decision regarding hospitalization.   Patient has blood pressure which is very very elevated, I just took the blood pressure in the room on her initial intake and it was 220/100.  She will get hydralazine, lab work-up and I will discuss with OB/GYN.  I will also add some magnesium and some IV fluids.  Has no neurologic symptoms, no visual changes.  Labs have been rather reassuring, there is some proteinuria as expected with preeclampsia, no significant liver elevation or low platelets  Labs were ordered, hydralazine ordered, I discussed the care with Dr. Jolayne Panther of the women service and she is excepted transfer of the patient to the maternal admissions unit, she is agreeable with hydralazine.  Patient will be transferred at this time, due to the potential the critical nature of her preeclampsia which could worsen.  With her severe hypertension at over 220 systolic at the  last measurement she will undoubtedly need aggressive blood pressure management which will be started the emergency department.        Final Clinical Impression(s) / ED Diagnoses Final diagnoses:  Pre-eclampsia, antepartum     Eber Hong, MD 09/20/21 2232

## 2021-09-20 NOTE — ED Triage Notes (Addendum)
Pt states she has had high BP since this morning- was 196/110. Pt had a baby 5 days ago and states MD told her to seek emergency care if her BP was elevated. Pt takes Labetalol 100mg  bid and has had both doses today.

## 2021-09-21 ENCOUNTER — Other Ambulatory Visit: Payer: Self-pay

## 2021-09-21 ENCOUNTER — Encounter (HOSPITAL_COMMUNITY): Payer: Self-pay | Admitting: Obstetrics and Gynecology

## 2021-09-21 ENCOUNTER — Telehealth (HOSPITAL_COMMUNITY): Payer: Self-pay | Admitting: *Deleted

## 2021-09-21 DIAGNOSIS — O1415 Severe pre-eclampsia, complicating the puerperium: Secondary | ICD-10-CM | POA: Diagnosis not present

## 2021-09-21 DIAGNOSIS — Z87891 Personal history of nicotine dependence: Secondary | ICD-10-CM | POA: Diagnosis not present

## 2021-09-21 DIAGNOSIS — O1003 Pre-existing essential hypertension complicating the puerperium: Secondary | ICD-10-CM | POA: Diagnosis present

## 2021-09-21 DIAGNOSIS — Z98891 History of uterine scar from previous surgery: Secondary | ICD-10-CM | POA: Diagnosis not present

## 2021-09-21 DIAGNOSIS — Z79899 Other long term (current) drug therapy: Secondary | ICD-10-CM | POA: Diagnosis not present

## 2021-09-21 MED ORDER — NIFEDIPINE ER OSMOTIC RELEASE 30 MG PO TB24
30.0000 mg | ORAL_TABLET | ORAL | Status: DC
  Administered 2021-09-21 – 2021-09-22 (×2): 30 mg via ORAL
  Filled 2021-09-21 (×2): qty 1

## 2021-09-21 MED ORDER — DOCUSATE SODIUM 100 MG PO CAPS
100.0000 mg | ORAL_CAPSULE | Freq: Every day | ORAL | Status: DC
  Administered 2021-09-21: 100 mg via ORAL
  Filled 2021-09-21: qty 1

## 2021-09-21 MED ORDER — NIFEDIPINE ER OSMOTIC RELEASE 30 MG PO TB24: 30.0000 mg | ORAL_TABLET | Freq: Every day | ORAL | Status: DC

## 2021-09-21 MED ORDER — MAGNESIUM SULFATE BOLUS VIA INFUSION: 4.0000 g | Freq: Once | INTRAVENOUS | Status: DC

## 2021-09-21 MED ORDER — MAGNESIUM SULFATE BOLUS VIA INFUSION
4.0000 g | Freq: Once | INTRAVENOUS | Status: AC
  Administered 2021-09-21: 4 g via INTRAVENOUS
  Filled 2021-09-21: qty 1000

## 2021-09-21 MED ORDER — LACTATED RINGERS IV SOLN: INTRAVENOUS | Status: DC

## 2021-09-21 MED ORDER — MAGNESIUM SULFATE 4 GM/100ML IV SOLN: 4.0000 g | Freq: Once | INTRAVENOUS | Status: DC

## 2021-09-21 MED ORDER — HYDRALAZINE HCL 20 MG/ML IJ SOLN
10.0000 mg | Freq: Once | INTRAMUSCULAR | Status: AC
  Administered 2021-09-21: 10 mg via INTRAVENOUS
  Filled 2021-09-21: qty 1

## 2021-09-21 MED ORDER — MAGNESIUM SULFATE 40 GM/1000ML IV SOLN: 2.0000 g/h | Freq: Once | INTRAVENOUS | Status: DC

## 2021-09-21 MED ORDER — DIPHENHYDRAMINE HCL 50 MG/ML IJ SOLN
25.0000 mg | Freq: Once | INTRAMUSCULAR | Status: AC
  Administered 2021-09-21: 25 mg via INTRAVENOUS
  Filled 2021-09-21: qty 1

## 2021-09-21 MED ORDER — HYDROMORPHONE HCL 1 MG/ML IJ SOLN: 1.0000 mg | INTRAMUSCULAR | Status: DC | PRN

## 2021-09-21 MED ORDER — METOCLOPRAMIDE HCL 5 MG/ML IJ SOLN
10.0000 mg | Freq: Once | INTRAMUSCULAR | Status: AC
  Administered 2021-09-21: 10 mg via INTRAVENOUS
  Filled 2021-09-21: qty 2

## 2021-09-21 MED ORDER — MAGNESIUM SULFATE 40 GM/1000ML IV SOLN
2.0000 g/h | INTRAVENOUS | Status: AC
  Administered 2021-09-21 (×2): 2 g/h via INTRAVENOUS
  Filled 2021-09-21 (×2): qty 1000

## 2021-09-21 MED ORDER — ACETAMINOPHEN 325 MG PO TABS
650.0000 mg | ORAL_TABLET | ORAL | Status: DC | PRN
  Administered 2021-09-21: 650 mg via ORAL
  Filled 2021-09-21: qty 2

## 2021-09-21 MED ORDER — ZOLPIDEM TARTRATE 5 MG PO TABS: 5.0000 mg | ORAL_TABLET | Freq: Every evening | ORAL | Status: DC | PRN

## 2021-09-21 MED ORDER — PRENATAL MULTIVITAMIN CH
1.0000 | ORAL_TABLET | Freq: Every day | ORAL | Status: DC
  Administered 2021-09-21: 1 via ORAL
  Filled 2021-09-21: qty 1

## 2021-09-21 MED ORDER — CALCIUM CARBONATE ANTACID 500 MG PO CHEW: 2.0000 | CHEWABLE_TABLET | ORAL | Status: DC | PRN

## 2021-09-21 MED ORDER — IBUPROFEN 600 MG PO TABS
600.0000 mg | ORAL_TABLET | Freq: Four times a day (QID) | ORAL | Status: DC | PRN
  Administered 2021-09-21: 600 mg via ORAL
  Filled 2021-09-21: qty 1

## 2021-09-21 NOTE — ED Notes (Signed)
Report given to [place], RN with [employer] 

## 2021-09-21 NOTE — Plan of Care (Signed)
  Problem: Education: Goal: Knowledge of Childbirth will improve Outcome: Not Applicable Goal: Ability to make informed decisions regarding treatment and plan of care will improve Outcome: Not Applicable Goal: Ability to state and carry out methods to decrease the pain will improve Outcome: Not Applicable Goal: Individualized Educational Video(s) Outcome: Not Applicable   Problem: Coping: Goal: Ability to verbalize concerns and feelings about labor and delivery will improve Outcome: Not Applicable   Problem: Life Cycle: Goal: Ability to make normal progression through stages of labor will improve Outcome: Not Applicable Goal: Ability to effectively push during vaginal delivery will improve Outcome: Not Applicable   Problem: Role Relationship: Goal: Will demonstrate positive interactions with the child Outcome: Not Applicable   Problem: Safety: Goal: Risk of complications during labor and delivery will decrease Outcome: Not Applicable   Problem: Pain Management: Goal: Relief or control of pain from uterine contractions will improve Outcome: Not Applicable   Problem: Education: Goal: Knowledge of condition will improve Outcome: Not Applicable Goal: Individualized Educational Video(s) Outcome: Not Applicable Goal: Individualized Newborn Educational Video(s) Outcome: Not Applicable   Problem: Activity: Goal: Will verbalize the importance of balancing activity with adequate rest periods Outcome: Not Applicable Goal: Ability to tolerate increased activity will improve Outcome: Not Applicable   Problem: Coping: Goal: Ability to identify and utilize available resources and services will improve Outcome: Not Applicable   Problem: Life Cycle: Goal: Chance of risk for complications during the postpartum period will decrease Outcome: Not Applicable   Problem: Role Relationship: Goal: Ability to demonstrate positive interaction with newborn will improve Outcome: Not  Applicable   Problem: Skin Integrity: Goal: Demonstration of wound healing without infection will improve Outcome: Not Applicable   Problem: Education: Goal: Knowledge of disease or condition will improve Outcome: Not Applicable Goal: Knowledge of the prescribed therapeutic regimen will improve Outcome: Not Applicable Goal: Individualized Educational Video(s) Outcome: Not Applicable   Problem: Clinical Measurements: Goal: Complications related to the disease process, condition or treatment will be avoided or minimized Outcome: Not Applicable

## 2021-09-21 NOTE — H&P (Signed)
Chief Complaint:  Hypertension   Event Date/Time   First Provider Initiated Contact with Patient 09/21/21 0155       HPI: Debra Glass is a 36 y.o. [id number] who presents to maternity admissions from the ED  reporting elevated Blood Pressure with severe headache. She had a SVD on 09/14/21 with pregnancy remarkable for chronic hypertension.Marland KitchenHer BP increased this morning and remained high all day, despite taking Labetalol BID as ordered.  Denies edema or RUQ pain.  Does report persistent severe headache despite treatment with Magnesium Sulfate and Tylenol in ED.   Hypertension This is a recurrent problem. The current episode started today. Associated symptoms include headaches. Pertinent negatives include no blurred vision, chest pain, palpitations, peripheral edema or shortness of breath. Treatments tried: Labetalol.  Headache  This is a new problem. The current episode started today. The problem occurs constantly. The problem has been unchanged. The quality of the pain is described as aching. Pertinent negatives include no abdominal pain, blurred vision, dizziness, fever or nausea. The symptoms are aggravated by bright light. She has tried acetaminophen for the symptoms. The treatment provided no relief. Her past medical history is significant for hypertension.   ED Note: Pt states she has had high BP since this morning- was 196/110. Pt had a baby 5 days ago and states MD told her to seek emergency care if her BP was elevated. Pt takes Labetalol 100mg  bid and has had both doses today.    Past Medical History: Past Medical History:  Diagnosis Date   Pregnancy induced hypertension     Past obstetric history: OB History  Gravida Para Term Preterm AB Living  5 4 1 3 1 4   SAB IAB Ectopic Multiple Live Births  1     0 3    # Outcome Date GA Lbr Len/2nd Weight Sex Delivery Anes PTL Lv  5 Term 09/14/21 [redacted]w[redacted]d 17:13 / 00:16 3120 g F VBAC EPI  LIV  4 Preterm 06/18/20 [redacted]w[redacted]d  1389 g M  CS-LTranv Gen  FD  3 Preterm 11/19/09 [redacted]w[redacted]d   F Vag-Spont   LIV  2 Preterm 07/08/08 [redacted]w[redacted]d   F Vag-Spont  N LIV     Complications: Premature rupture of membranes  1 SAB             Past Surgical History: Past Surgical History:  Procedure Laterality Date   CESAREAN SECTION N/A 06/18/2020   Procedure: CESAREAN SECTION;  Surgeon: [redacted]w[redacted]d, DO;  Location: [facility] LD ORS;  Service: Obstetrics;  Laterality: N/A;   DILATION AND CURETTAGE OF UTERUS      Family History: Family History  Problem Relation Age of Onset   Heart disease Maternal Grandmother        had a pacemaker   Cancer Maternal Grandfather     Social History: Social History   Tobacco Use   Smoking status: Former    Types: Cigarettes   Smokeless tobacco: Never  Vaping Use   Vaping Use: Never used  Substance Use Topics   Alcohol use: Not Currently    Comment: occasionally   Drug use: No    Allergies: No Known Allergies  Meds:  Medications Prior to Admission  Medication Sig Dispense Refill Last Dose   ibuprofen (ADVIL) 600 MG tablet Take 1 tablet (600 mg total) by mouth every 6 (six) hours. 30 tablet 0 09/20/2021 at 1400   labetalol (NORMODYNE) 100 MG tablet TAKE 2 TABLETS(200 MG) BY MOUTH TWICE DAILY (Patient taking differently: Take 100 mg  by mouth 2 (two) times daily.) 60 tablet 2 09/20/2021 at 1200   Prenatal Vit-Fe Fumarate-FA (PRENATAL PO) Take 2 tablets by mouth daily.   Past Week   furosemide (LASIX) 20 MG tablet Take 1 tablet (20 mg total) by mouth daily for 5 days. 5 tablet 0     I have reviewed patient's Past Medical Hx, Surgical Hx, Family Hx, Social Hx, medications and allergies.  ROS:  Review of Systems  Constitutional:  Negative for fever.  Eyes:  Negative for blurred vision.  Respiratory:  Negative for shortness of breath.   Cardiovascular:  Negative for chest pain and palpitations.  Gastrointestinal:  Negative for abdominal pain and nausea.  Neurological:  Positive for headaches. Negative for  dizziness.   Other systems negative     Physical Exam  Patient Vitals for the past 24 hrs:  BP Temp Temp src Pulse Resp SpO2 Height Weight  09/21/21 0141 (!) 160/80 98.1 F (36.7 C) Oral (!) 58 17 100 % -- --  09/21/21 0030 (!) 168/87 -- -- (!) 54 18 100 % -- --  09/21/21 0000 (!) 174/84 -- -- (!) 51 (!) 26 98 % -- --  09/20/21 2330 (!) 187/94 -- -- (!) 52 13 100 % -- --  09/20/21 2300 (!) 188/91 -- -- (!) 52 14 100 % -- --  09/20/21 2238 (!) 172/85 -- -- (!) 59 18 100 % -- --  09/20/21 2200 (!) 171/89 -- -- (!) 56 13 100 % -- --  09/20/21 2130 (!) 189/93 -- -- (!) 48 13 100 % -- --  09/20/21 2126 (!) 183/96 -- -- (!) 59 13 100 % -- --  09/20/21 2100 (!) 202/112 -- -- (!) 52 12 100 % -- --  09/20/21 2048 (!) 188/101 98.3 F (36.8 C) Oral (!) 53 19 97 % -- --  09/20/21 2047 -- -- -- -- -- -- 5\' 6"  (1.676 m) 74.8 kg   Constitutional: Well-developed, well-nourished female in no acute distress.  Cardiovascular: normal rate and rhythm, no ectopy audible, S1 & S2 heard, no murmur Respiratory: normal effort, no distress. Lungs CTAB with no wheezes or crackles GI: Abd soft, non-tender.  Nondistended.  No rebound, No guarding.  Bowel Sounds audible  MS: Extremities nontender, no edema, normal ROM Neurologic: Alert and oriented x 4.   Grossly nonfocal. GU: Neg CVAT. Skin:  Warm and Dry Psych:  Affect appropriate.   Labs: Results for orders placed or performed during the hospital encounter of 09/20/21 (from the past 24 hour(s))  CBC with Differential     Status: Abnormal   Collection Time: 09/20/21  9:02 PM  Result Value Ref Range   WBC 6.2 4.0 - 10.5 K/uL   RBC 4.70 3.87 - 5.11 MIL/uL   Hemoglobin 12.5 12.0 - 15.0 g/dL   HCT 09/22/21 70.6 - 23.7 %   MCV 79.8 (L) 80.0 - 100.0 fL   MCH 26.6 26.0 - 34.0 pg   MCHC 33.3 30.0 - 36.0 g/dL   RDW 62.8 31.5 - 17.6 %   Platelets 276 150 - 400 K/uL   nRBC 0.0 0.0 - 0.2 %   Neutrophils Relative % 45 %   Neutro Abs 2.8 1.7 - 7.7 K/uL    Lymphocytes Relative 43 %   Lymphs Abs 2.7 0.7 - 4.0 K/uL   Monocytes Relative 7 %   Monocytes Absolute 0.4 0.1 - 1.0 K/uL   Eosinophils Relative 4 %   Eosinophils Absolute 0.3 0.0 - 0.5 K/uL  Basophils Relative 1 %   Basophils Absolute 0.0 0.0 - 0.1 K/uL   Immature Granulocytes 0 %   Abs Immature Granulocytes 0.02 0.00 - 0.07 K/uL  Comprehensive metabolic panel     Status: Abnormal   Collection Time: 09/20/21  9:02 PM  Result Value Ref Range   Sodium 138 135 - 145 mmol/L   Potassium 3.6 3.5 - 5.1 mmol/L   Chloride 109 98 - 111 mmol/L   CO2 20 (L) 22 - 32 mmol/L   Glucose, Bld 86 70 - 99 mg/dL   BUN 14 6 - 20 mg/dL   Creatinine, Ser 0.89 0.44 - 1.00 mg/dL   Calcium 8.8 (L) 8.9 - 10.3 mg/dL   Total Protein 6.4 (L) 6.5 - 8.1 g/dL   Albumin 3.1 (L) 3.5 - 5.0 g/dL   AST 24 15 - 41 U/L   ALT 32 0 - 44 U/L   Alkaline Phosphatase 114 38 - 126 U/L   Total Bilirubin 0.6 0.3 - 1.2 mg/dL   GFR, Estimated >60 >60 mL/min   Anion gap 9 5 - 15  Protime-INR     Status: None   Collection Time: 09/20/21  9:02 PM  Result Value Ref Range   Prothrombin Time 12.6 11.4 - 15.2 seconds   INR 1.0 0.8 - 1.2  Urinalysis, Routine w reflex microscopic Urine, Clean Catch     Status: Abnormal   Collection Time: 09/20/21  9:50 PM  Result Value Ref Range   Color, Urine YELLOW YELLOW   APPearance HAZY (A) CLEAR   Specific Gravity, Urine 1.008 1.005 - 1.030   pH 7.0 5.0 - 8.0   Glucose, UA NEGATIVE NEGATIVE mg/dL   Hgb urine dipstick LARGE (A) NEGATIVE   Bilirubin Urine NEGATIVE NEGATIVE   Ketones, ur 5 (A) NEGATIVE mg/dL   Protein, ur 30 (A) NEGATIVE mg/dL   Nitrite NEGATIVE NEGATIVE   Leukocytes,Ua MODERATE (A) NEGATIVE   RBC / HPF 0-5 0 - 5 RBC/hpf   WBC, UA 21-50 0 - 5 WBC/hpf   Bacteria, UA RARE (A) NONE SEEN   Squamous Epithelial / LPF 6-10 0 - 5   Mucus PRESENT    --/--/O POS (09/13 0973)  Imaging:   MAU Course/MDM: I have reviewed the triage vital signs and the nursing notes.    Pertinent labs & imaging results that were available during my care of the patient were reviewed by me and considered in my medical decision making (see chart for details).      I have reviewed her medical records including past results, notes and treatments.   Results reviewed.   Labs normal except borderline serum creatinine  Consult Dr Elly Modena who recommends admission and Magnesium Sulfate with Procardia XL for antihypertensive treatment.   Treatments in MAU included Reglan and Benadryl, Procardia XL, Mag Sulfate Infusion.   Pt stable at time of transfer  Assessment: Postpartum Day #5 Postpartum Preeclampsia with severe features  Plan: Admit  Magnesium Sulfate infusion Analgesia for headache Routine orders MD to follow   Hansel Feinstein CNM, MSN Certified Nurse-Midwife 09/21/2021 1:55 AM

## 2021-09-21 NOTE — MAU Provider Note (Signed)
Chief Complaint:  Hypertension   Event Date/Time   First Provider Initiated Contact with Patient 09/21/21 0155       HPI: Debra Glass is a 36 y.o. [id number] who presents to maternity admissions from the ED  reporting elevated Blood Pressure with severe headache. She had a SVD on 09/14/21 with pregnancy remarkable for chronic hypertension.Marland KitchenHer BP increased this morning and remained high all day, despite taking Labetalol BID as ordered.  Denies edema or RUQ pain.  Does report persistent severe headache despite treatment with Magnesium Sulfate and Tylenol in ED.   Hypertension This is a recurrent problem. The current episode started today. Associated symptoms include headaches. Pertinent negatives include no blurred vision, chest pain, palpitations, peripheral edema or shortness of breath. Treatments tried: Labetalol.  Headache  This is a new problem. The current episode started today. The problem occurs constantly. The problem has been unchanged. The quality of the pain is described as aching. Pertinent negatives include no abdominal pain, blurred vision, dizziness, fever or nausea. The symptoms are aggravated by bright light. She has tried acetaminophen for the symptoms. The treatment provided no relief. Her past medical history is significant for hypertension.   ED Note: Pt states she has had high BP since this morning- was 196/110. Pt had a baby 5 days ago and states MD told her to seek emergency care if her BP was elevated. Pt takes Labetalol 100mg  bid and has had both doses today.    Past Medical History: Past Medical History:  Diagnosis Date   Pregnancy induced hypertension     Past obstetric history: OB History  Gravida Para Term Preterm AB Living  5 4 1 3 1 4   SAB IAB Ectopic Multiple Live Births  1     0 3    # Outcome Date GA Lbr Len/2nd Weight Sex Delivery Anes PTL Lv  5 Term 09/14/21 [redacted]w[redacted]d 17:13 / 00:16 3120 g F VBAC EPI  LIV  4 Preterm 06/18/20 [redacted]w[redacted]d  1389 g M  CS-LTranv Gen  FD  3 Preterm 11/19/09 [redacted]w[redacted]d   F Vag-Spont   LIV  2 Preterm 07/08/08 [redacted]w[redacted]d   F Vag-Spont  N LIV     Complications: Premature rupture of membranes  1 SAB             Past Surgical History: Past Surgical History:  Procedure Laterality Date   CESAREAN SECTION N/A 06/18/2020   Procedure: CESAREAN SECTION;  Surgeon: [redacted]w[redacted]d, DO;  Location: [facility]S;  Service: Obstetrics;  Laterality: N/A;   DILATION AND CURETTAGE OF UTERUS      Family History: Family History  Problem Relation Age of Onset   Heart disease Maternal Grandmother        had a pacemaker   Cancer Maternal Grandfather     Social History: Social History   Tobacco Use   Smoking status: Former    Types: Cigarettes   Smokeless tobacco: Never  Vaping Use   Vaping Use: Never used  Substance Use Topics   Alcohol use: Not Currently    Comment: occasionally   Drug use: No    Allergies: No Known Allergies  Meds:  Medications Prior to Admission  Medication Sig Dispense Refill Last Dose   ibuprofen (ADVIL) 600 MG tablet Take 1 tablet (600 mg total) by mouth every 6 (six) hours. 30 tablet 0 09/20/2021 at 1400   labetalol (NORMODYNE) 100 MG tablet TAKE 2 TABLETS(200 MG) BY MOUTH TWICE DAILY (Patient taking differently: Take 100 mg  by mouth 2 (two) times daily.) 60 tablet 2 09/20/2021 at 1200   Prenatal Vit-Fe Fumarate-FA (PRENATAL PO) Take 2 tablets by mouth daily.   Past Week   furosemide (LASIX) 20 MG tablet Take 1 tablet (20 mg total) by mouth daily for 5 days. 5 tablet 0     I have reviewed patient's Past Medical Hx, Surgical Hx, Family Hx, Social Hx, medications and allergies.  ROS:  Review of Systems  Constitutional:  Negative for fever.  Eyes:  Negative for blurred vision.  Respiratory:  Negative for shortness of breath.   Cardiovascular:  Negative for chest pain and palpitations.  Gastrointestinal:  Negative for abdominal pain and nausea.  Neurological:  Positive for headaches. Negative for  dizziness.   Other systems negative     Physical Exam  Patient Vitals for the past 24 hrs:  BP Temp Temp src Pulse Resp SpO2 Height Weight  09/21/21 0141 (!) 160/80 98.1 F (36.7 C) Oral (!) 58 17 100 % -- --  09/21/21 0030 (!) 168/87 -- -- (!) 54 18 100 % -- --  09/21/21 0000 (!) 174/84 -- -- (!) 51 (!) 26 98 % -- --  09/20/21 2330 (!) 187/94 -- -- (!) 52 13 100 % -- --  09/20/21 2300 (!) 188/91 -- -- (!) 52 14 100 % -- --  09/20/21 2238 (!) 172/85 -- -- (!) 59 18 100 % -- --  09/20/21 2200 (!) 171/89 -- -- (!) 56 13 100 % -- --  09/20/21 2130 (!) 189/93 -- -- (!) 48 13 100 % -- --  09/20/21 2126 (!) 183/96 -- -- (!) 59 13 100 % -- --  09/20/21 2100 (!) 202/112 -- -- (!) 52 12 100 % -- --  09/20/21 2048 (!) 188/101 98.3 F (36.8 C) Oral (!) 53 19 97 % -- --  09/20/21 2047 -- -- -- -- -- -- 5\' 6"  (1.676 m) 74.8 kg   Constitutional: Well-developed, well-nourished female in no acute distress.  Cardiovascular: normal rate and rhythm, no ectopy audible, S1 & S2 heard, no murmur Respiratory: normal effort, no distress. Lungs CTAB with no wheezes or crackles GI: Abd soft, non-tender.  Nondistended.  No rebound, No guarding.  Bowel Sounds audible  MS: Extremities nontender, no edema, normal ROM Neurologic: Alert and oriented x 4.   Grossly nonfocal. GU: Neg CVAT. Skin:  Warm and Dry Psych:  Affect appropriate.   Labs: Results for orders placed or performed during the hospital encounter of 09/20/21 (from the past 24 hour(s))  CBC with Differential     Status: Abnormal   Collection Time: 09/20/21  9:02 PM  Result Value Ref Range   WBC 6.2 4.0 - 10.5 K/uL   RBC 4.70 3.87 - 5.11 MIL/uL   Hemoglobin 12.5 12.0 - 15.0 g/dL   HCT 09/22/21 70.6 - 23.7 %   MCV 79.8 (L) 80.0 - 100.0 fL   MCH 26.6 26.0 - 34.0 pg   MCHC 33.3 30.0 - 36.0 g/dL   RDW 62.8 31.5 - 17.6 %   Platelets 276 150 - 400 K/uL   nRBC 0.0 0.0 - 0.2 %   Neutrophils Relative % 45 %   Neutro Abs 2.8 1.7 - 7.7 K/uL    Lymphocytes Relative 43 %   Lymphs Abs 2.7 0.7 - 4.0 K/uL   Monocytes Relative 7 %   Monocytes Absolute 0.4 0.1 - 1.0 K/uL   Eosinophils Relative 4 %   Eosinophils Absolute 0.3 0.0 - 0.5 K/uL  Basophils Relative 1 %   Basophils Absolute 0.0 0.0 - 0.1 K/uL   Immature Granulocytes 0 %   Abs Immature Granulocytes 0.02 0.00 - 0.07 K/uL  Comprehensive metabolic panel     Status: Abnormal   Collection Time: 09/20/21  9:02 PM  Result Value Ref Range   Sodium 138 135 - 145 mmol/L   Potassium 3.6 3.5 - 5.1 mmol/L   Chloride 109 98 - 111 mmol/L   CO2 20 (L) 22 - 32 mmol/L   Glucose, Bld 86 70 - 99 mg/dL   BUN 14 6 - 20 mg/dL   Creatinine, Ser 0.89 0.44 - 1.00 mg/dL   Calcium 8.8 (L) 8.9 - 10.3 mg/dL   Total Protein 6.4 (L) 6.5 - 8.1 g/dL   Albumin 3.1 (L) 3.5 - 5.0 g/dL   AST 24 15 - 41 U/L   ALT 32 0 - 44 U/L   Alkaline Phosphatase 114 38 - 126 U/L   Total Bilirubin 0.6 0.3 - 1.2 mg/dL   GFR, Estimated >60 >60 mL/min   Anion gap 9 5 - 15  Protime-INR     Status: None   Collection Time: 09/20/21  9:02 PM  Result Value Ref Range   Prothrombin Time 12.6 11.4 - 15.2 seconds   INR 1.0 0.8 - 1.2  Urinalysis, Routine w reflex microscopic Urine, Clean Catch     Status: Abnormal   Collection Time: 09/20/21  9:50 PM  Result Value Ref Range   Color, Urine YELLOW YELLOW   APPearance HAZY (A) CLEAR   Specific Gravity, Urine 1.008 1.005 - 1.030   pH 7.0 5.0 - 8.0   Glucose, UA NEGATIVE NEGATIVE mg/dL   Hgb urine dipstick LARGE (A) NEGATIVE   Bilirubin Urine NEGATIVE NEGATIVE   Ketones, ur 5 (A) NEGATIVE mg/dL   Protein, ur 30 (A) NEGATIVE mg/dL   Nitrite NEGATIVE NEGATIVE   Leukocytes,Ua MODERATE (A) NEGATIVE   RBC / HPF 0-5 0 - 5 RBC/hpf   WBC, UA 21-50 0 - 5 WBC/hpf   Bacteria, UA RARE (A) NONE SEEN   Squamous Epithelial / LPF 6-10 0 - 5   Mucus PRESENT    --/--/O POS (09/13 0947)  Imaging:   MAU Course/MDM: I have reviewed the triage vital signs and the nursing notes.    Pertinent labs & imaging results that were available during my care of the patient were reviewed by me and considered in my medical decision making (see chart for details).      I have reviewed her medical records including past results, notes and treatments.   Results reviewed.   Labs normal except borderline serum creatinine  Consult Dr Constant who recommends admission and Magnesium Sulfate with Procardia XL for antihypertensive treatment.   Treatments in MAU included Reglan and Benadryl, Procardia XL, Mag Sulfate Infusion.   Pt stable at time of transfer  Assessment: Postpartum Day #5 Postpartum Preeclampsia with severe features  Plan: Admit  Magnesium Sulfate infusion Analgesia for headache Routine orders MD to follow   Dawnya Grams CNM, MSN Certified Nurse-Midwife 09/21/2021 1:55 AM  

## 2021-09-21 NOTE — Telephone Encounter (Signed)
Attempted hospital discharge follow-up call. Left message for patient to return RN call with any questions or concerns. Erline Levine, RN 09/21/21, [phone]

## 2021-09-21 NOTE — MAU Note (Signed)
.  Debra Glass is a 36 y.o. at Unknown here in MAU reporting: here via [facility]link from [facility]. PP vaginal delivery on 9/13. Reports HA that started this morning around 0900 and it wasn't going away throughout the day after taking ibuprofen and blood pressure medication. Took her BP a couple of times throughout the day and had multiple high readings. Went to [facility] around 2030 for high blood pressure and HA. Reports HA has continued and rates it 9/10. Denies any visual disturbances or epigastric pain.   Onset of complaint: 0900 Pain score: 9 Vitals:   09/21/21 0030 09/21/21 0141  BP: (!) 168/87 (!) 160/80  Pulse: (!) 54 (!) 58  Resp: 18 17  Temp:  98.1 F (36.7 C)  SpO2: 100% 100%     FHT:pt is PP  Lab orders placed from triage:  none.

## 2021-09-22 ENCOUNTER — Other Ambulatory Visit: Payer: Medicaid Other

## 2021-09-22 ENCOUNTER — Other Ambulatory Visit (HOSPITAL_COMMUNITY): Payer: Self-pay

## 2021-09-22 ENCOUNTER — Encounter: Payer: Medicaid Other | Admitting: Advanced Practice Midwife

## 2021-09-22 ENCOUNTER — Ambulatory Visit: Payer: Medicaid Other

## 2021-09-22 DIAGNOSIS — O1415 Severe pre-eclampsia, complicating the puerperium: Secondary | ICD-10-CM | POA: Diagnosis not present

## 2021-09-22 MED ORDER — FUROSEMIDE 20 MG PO TABS
20.0000 mg | ORAL_TABLET | Freq: Every day | ORAL | 0 refills | Status: DC
  Filled 2021-09-22: qty 5, 5d supply, fill #0

## 2021-09-22 MED ORDER — NIFEDIPINE ER 30 MG PO TB24
30.0000 mg | ORAL_TABLET | Freq: Two times a day (BID) | ORAL | 1 refills | Status: DC
  Filled 2021-09-22: qty 60, 30d supply, fill #0

## 2021-09-22 MED ORDER — FUROSEMIDE 20 MG PO TABS: 20.0000 mg | ORAL_TABLET | Freq: Every day | ORAL | Status: DC

## 2021-09-22 MED ORDER — NIFEDIPINE ER OSMOTIC RELEASE 30 MG PO TB24: 30.0000 mg | ORAL_TABLET | Freq: Two times a day (BID) | ORAL | Status: DC

## 2021-09-22 NOTE — Discharge Summary (Signed)
Physician Discharge Summary  Patient ID: Debra Glass MRN: [id number] DOB/AGE: October 29, 1985 36 y.o.  Admit date: 09/20/2021 Discharge date: 09/22/2021   Discharge Diagnoses:  Principal Problem:   Hypertension in pregnancy, preeclampsia, severe, delivered/postpartum   Consults: None  Significant Diagnostic Studies:  No results found for this or any previous visit (from the past 24 hour(s)).  US OB Follow Up  Result Date: 09/12/2021 Table formatting from the original result was not included. Images from the original result were not included.  ..an [employer] Diplomatic Services operational officer) accredited practice Center for [facility] @ [place] [address] Ordering Provider: Janyth Pupa, DO FOLLOW UP SONOGRAM Debra Glass is in the office for a follow up sonogram for EFW,BPP and cord dopplers. She is a 36 y.o. year old [phone] with Estimated Date of Delivery: 09/30/21 by early ultrasound now at  [redacted]w[redacted]d weeks gestation. Thus far the pregnancy has been complicated by HX of IUFD,CHTN,preterm delivery . GESTATION: SINGLETON PRESENTATION: cephalic FETAL ACTIVITY:          Heart rate         144          The fetus is active. AMNIOTIC FLUID: The amniotic fluid volume is  normal, 12.7 cm. PLACENTA LOCALIZATION:  posterior GRADE 3 CERVIX: Limited view GESTATIONAL AGE AND  BIOMETRICS: Gestational criteria: Estimated Date of Delivery: 09/30/21 by early ultrasound now at [redacted]w[redacted]d Previous Scans:8          BIPARIETAL DIAMETER           8.94 cm         36+1 weeks HEAD CIRCUMFERENCE           32.50 cm         36+6 weeks ABDOMINAL CIRCUMFERENCE           35.73 cm         39+5 weeks FEMUR LENGTH           7.26 cm         37+1 weeks                                                       AVERAGE EGA(BY THIS SCAN):  37+3 weeks                                                 ESTIMATED FETAL WEIGHT:       3466  grams, 80 % BIOPHYSICAL PROFILE:                                                                                                       COMMENTS GROSS BODY MOVEMENT  2  TONE                2  RESPIRATIONS                2  AMNIOTIC FLUID                2                                                          SCORE:  8/8 (Note: NST was not performed as part of this antepartum testing)  DOPPLER FLOW STUDIES: UMBILICAL ARTERY RI RATIOS:  .49,.49,.53=25% ANATOMICAL SURVEY                                                                            COMMENTS CEREBRAL VENTRICLES yes normal  CHOROID PLEXUS yes normal  CEREBELLUM yes normal  CISTERNA MAGNA  Yes  normal   CAVUM SEPTI PELLUCIDI YES NORMAL              NOSE/LIP yes normal  FACIAL PROFILE yes normal  4 CHAMBERED HEART yes normal  OUTFLOW TRACTS YES normaL  3VV YES NORMAL  3VTV YES NORMAL  SITUS YES NORMAL      DIAPHRAGM yes normal  STOMACH yes normal  RENAL REGION yes normal  BLADDER yes normal          3 VESSEL CORD yes normal              GENITALIA yes normal female     SUSPECTED ABNORMALITIES:  no QUALITY OF SCAN: satisfactory TECHNICIAN COMMENTS: [place] 54+0 wks,cephalic,BPP 0/8,QPY 19.5 cm,FHR 144 bpm,EFW 3465 g 80%,RI .49,.49,.53=25% A copy of this report including all images has been saved and backed up to a second source for retrieval if needed. All measures and details of the anatomical scan, placentation, fluid volume and pelvic anatomy are contained in that report. Amber Heide Guile 09/12/2021 10:04 AM Clinical Impression and recommendations: I have reviewed the sonogram results above, combined with the patient's current clinical course, below are my impressions and any appropriate recommendations for management based on the sonographic findings. 1.  [id number] Estimated Date of Delivery: 09/30/21 by serial sonographic evaluations 2.  Fetal sonographic surveillance findings: a). Normal fluid volume b). Normal antepartum fetal assessment with BPP 8/8 c). Normal fetal Doppler ratios with consistent diastolic flow:   24% d). Normal growth percentile with appropriate interval growth:  80% 3.  Normal general sonographic findings Recommend continued prenatal evaluations and care based on this sonogram and as clinically indicated from the patient's clinical course. Clearnce Sorrel Ozan 09/12/2021 12:45 PM    US Fetal BPP W/O Non Stress  Result Date: 09/12/2021 Table formatting from the original result was not included. Images from the original result were not included.  ..an [employer] Diplomatic Services operational officer) accredited practice Center for [facility] @ [place] [address] Ordering Provider: Janyth Pupa, DO FOLLOW UP SONOGRAM Debra Glass is in the office for a follow up  sonogram for EFW,BPP and cord dopplers. She is a 36 y.o. year old [phone] with Estimated Date of Delivery: 09/30/21 by early ultrasound now at  [redacted]w[redacted]d weeks gestation. Thus far the pregnancy has been complicated by HX of IUFD,CHTN,preterm delivery . GESTATION: SINGLETON PRESENTATION: cephalic FETAL ACTIVITY:          Heart rate         144          The fetus is active. AMNIOTIC FLUID: The amniotic fluid volume is  normal, 12.7 cm. PLACENTA LOCALIZATION:  posterior GRADE 3 CERVIX: Limited view GESTATIONAL AGE AND  BIOMETRICS: Gestational criteria: Estimated Date of Delivery: 09/30/21 by early ultrasound now at [redacted]w[redacted]d Previous Scans:8          BIPARIETAL DIAMETER           8.94 cm         36+1 weeks HEAD CIRCUMFERENCE           32.50 cm         36+6 weeks ABDOMINAL CIRCUMFERENCE           35.73 cm         39+5 weeks FEMUR LENGTH           7.26 cm         37+1 weeks                                                       AVERAGE EGA(BY THIS SCAN):  37+3 weeks                                                 ESTIMATED FETAL WEIGHT:       3466  grams, 80 % BIOPHYSICAL PROFILE:                                                                                                      COMMENTS GROSS BODY MOVEMENT                  2  TONE                2  RESPIRATIONS                2  AMNIOTIC FLUID                2                                                          SCORE:  8/8 (Note: NST was not performed as part of this antepartum testing)  DOPPLER FLOW STUDIES: UMBILICAL ARTERY RI RATIOS:  .49,.49,.53=25% ANATOMICAL SURVEY                                                                            COMMENTS CEREBRAL VENTRICLES yes normal  CHOROID PLEXUS yes normal  CEREBELLUM yes normal  CISTERNA MAGNA  Yes  normal   CAVUM SEPTI PELLUCIDI YES NORMAL              NOSE/LIP yes normal  FACIAL PROFILE yes normal  4 CHAMBERED HEART yes normal  OUTFLOW TRACTS YES normaL  3VV YES NORMAL  3VTV YES NORMAL  SITUS YES NORMAL      DIAPHRAGM yes normal  STOMACH yes normal  RENAL REGION yes normal  BLADDER yes normal          3 VESSEL CORD yes normal              GENITALIA yes normal female     SUSPECTED ABNORMALITIES:  no QUALITY OF SCAN: satisfactory TECHNICIAN COMMENTS: [place] [id number] wks,cephalic,BPP [id number] cm,FHR 144 bpm,EFW 3465 g 80%,RI .49,.49,.53=25% A copy of this report including all images has been saved and backed up to a second source for retrieval if needed. All measures and details of the anatomical scan, placentation, fluid volume and pelvic anatomy are contained in that report. Amber Heide Guile 09/12/2021 10:04 AM Clinical Impression and recommendations: I have reviewed the sonogram results above, combined with the patient's current clinical course, below are my impressions and any appropriate recommendations for management based on the sonographic findings. 1.  [id number] Estimated Date of Delivery: 09/30/21 by serial sonographic evaluations 2.  Fetal sonographic surveillance findings: a). Normal fluid volume b). Normal antepartum fetal assessment with BPP 8/8 c). Normal fetal Doppler ratios with consistent diastolic flow:  123456 d). Normal growth percentile with appropriate interval growth:  80% 3.  Normal general sonographic findings  Recommend continued prenatal evaluations and care based on this sonogram and as clinically indicated from the patient's clinical course. Clearnce Sorrel Ozan 09/12/2021 12:45 PM    [place] UA Cord Doppler  Result Date: 09/12/2021 Table formatting from the original result was not included. Images from the original result were not included.  ..an [employer] Diplomatic Services operational officer) accredited practice Center for [facility] @ [place] [address] Ordering Provider: Janyth Pupa, DO FOLLOW UP SONOGRAM Debra Glass is in the office for a follow up sonogram for EFW,BPP and cord dopplers. She is a 36 y.o. year old [phone] with Estimated Date of Delivery: 09/30/21 by early ultrasound now at  [redacted]w[redacted]d weeks gestation. Thus far the pregnancy has been complicated by HX of IUFD,CHTN,preterm delivery . GESTATION: SINGLETON PRESENTATION: cephalic FETAL ACTIVITY:          Heart rate         144          The fetus is active. AMNIOTIC FLUID: The amniotic fluid volume is  normal, 12.7 cm. PLACENTA LOCALIZATION:  posterior GRADE 3 CERVIX: Limited view GESTATIONAL AGE AND  BIOMETRICS: Gestational criteria: Estimated Date of Delivery: 09/30/21 by early ultrasound now at [redacted]w[redacted]d Previous Scans:8  BIPARIETAL DIAMETER           8.94 cm         36+1 weeks HEAD CIRCUMFERENCE           32.50 cm         36+6 weeks ABDOMINAL CIRCUMFERENCE           35.73 cm         39+5 weeks FEMUR LENGTH           7.26 cm         37+1 weeks                                                       AVERAGE EGA(BY THIS SCAN):  37+3 weeks                                                 ESTIMATED FETAL WEIGHT:       3466  grams, 80 % BIOPHYSICAL PROFILE:                                                                                                      COMMENTS GROSS BODY MOVEMENT                 2  TONE                2  RESPIRATIONS                2  AMNIOTIC FLUID                2                                                           SCORE:  8/8 (Note: NST was not performed as part of this antepartum testing)  DOPPLER FLOW STUDIES: UMBILICAL ARTERY RI RATIOS:  .49,.49,.53=25% ANATOMICAL SURVEY                                                                            COMMENTS CEREBRAL VENTRICLES yes normal  CHOROID PLEXUS yes normal  CEREBELLUM yes normal  CISTERNA MAGNA  Yes  normal   CAVUM SEPTI PELLUCIDI YES NORMAL              NOSE/LIP yes  normal  FACIAL PROFILE yes normal  4 CHAMBERED HEART yes normal  OUTFLOW TRACTS YES normaL  3VV YES NORMAL  3VTV YES NORMAL  SITUS YES NORMAL      DIAPHRAGM yes normal  STOMACH yes normal  RENAL REGION yes normal  BLADDER yes normal          3 VESSEL CORD yes normal              GENITALIA yes normal female     SUSPECTED ABNORMALITIES:  no QUALITY OF SCAN: satisfactory TECHNICIAN COMMENTS: [place] [id number] wks,cephalic,BPP [id number] [id number] cm,FHR 144 bpm,EFW 3465 g 80%,RI .49,.49,.53=25% A copy of this report including all images has been saved and backed up to a second source for retrieval if needed. All measures and details of the anatomical scan, placentation, fluid volume and pelvic anatomy are contained in that report. Amber Heide Guile 09/12/2021 10:04 AM Clinical Impression and recommendations: I have reviewed the sonogram results above, combined with the patient's current clinical course, below are my impressions and any appropriate recommendations for management based on the sonographic findings. 1.  [id number] Estimated Date of Delivery: 09/30/21 by serial sonographic evaluations 2.  Fetal sonographic surveillance findings: a). Normal fluid volume b). Normal antepartum fetal assessment with BPP 8/8 c). Normal fetal Doppler ratios with consistent diastolic flow:  123456 d). Normal growth percentile with appropriate interval growth:  80% 3.  Normal general sonographic findings Recommend continued prenatal evaluations and care based on this sonogram and as clinically indicated from the patient's  clinical course. Clearnce Sorrel Ozan 09/12/2021 12:45 PM    US Fetal BPP W/O Non Stress  Result Date: 08/30/2021 Table formatting from the original result was not included. Images from the original result were not included.  ..an [employer] Diplomatic Services operational officer) accredited practice Center for [facility] @ [place] [address] Ordering Provider: Roma Schanz, CNM FOLLOW UP SONOGRAM Debra Glass is in the office for a follow up sonogram for BPP and cord dopplers. She is a 36 y.o. year old [phone] with Estimated Date of Delivery: 09/30/21 by early ultrasound now at  [redacted]w[redacted]d weeks gestation. Thus far the pregnancy has been complicated by [facility] of IUFD and preterm delivery . GESTATION: SINGLETON PRESENTATION: cephalic FETAL ACTIVITY:          Heart rate         124          The fetus is active. AMNIOTIC FLUID: The amniotic fluid volume is  normal, 15.6 cm. PLACENTA LOCALIZATION:  posterior GRADE 3 CERVIX: Limited view GESTATIONAL AGE AND  BIOMETRICS: Gestational criteria: Estimated Date of Delivery: 09/30/21 by early ultrasound now at [redacted]w[redacted]d Previous Scans:7 BIOPHYSICAL PROFILE:                                                                                                      COMMENTS GROSS BODY MOVEMENT                 2  TONE  2  RESPIRATIONS                2  AMNIOTIC FLUID                2                                                          SCORE:  8/8 (Note: NST was not performed as part of this antepartum testing)  DOPPLER FLOW STUDIES: UMBILICAL ARTERY RI RATIOS:   .61,.63,.55,.52=44% ANATOMICAL SURVEY                                                                            COMMENTS CEREBRAL VENTRICLES yes normal  CHOROID PLEXUS yes normal                              FACIAL PROFILE yes normal  4 CHAMBERED HEART yes normal  OUTFLOW TRACTS YES normaL  3VV YES NORMAL  3VTV YES NORMAL  SITUS YES NORMAL      DIAPHRAGM yes normal   STOMACH yes normal  RENAL REGION yes normal  BLADDER yes normal          3 VESSEL CORD yes normal              GENITALIA yes normal female     SUSPECTED ABNORMALITIES:  no QUALITY OF SCAN: satisfactory TECHNICIAN COMMENTS: [place] Q000111Q wks,cephalic,BPP [id number] placenta gr 3,RI .61,.63,.55,.52=44%,AFI 15.6 cm,FHR 124 bpm A copy of this report including all images has been saved and backed up to a second source for retrieval if needed. All measures and details of the anatomical scan, placentation, fluid volume and pelvic anatomy are contained in that report. Amber Heide Guile 08/29/2021 12:10 PM Clinical Impression and recommendations: I have reviewed the sonogram results above, combined with the patient's current clinical course, below are my impressions and any appropriate recommendations for management based on the sonographic findings. 1.  [id number] Estimated Date of Delivery: 09/30/21 by serial sonographic evaluations 2.  Fetal sonographic surveillance findings: a). Normal fluid volume b). Normal antepartum fetal assessment with BPP 8/8 c). Normal fetal Doppler ratios with consistent diastolic flow:  [id number] 3.  Normal general sonographic findings Recommend continued prenatal evaluations and care based on this sonogram and as clinically indicated from the patient's clinical course. Janyth Pupa, DO Attending [place], [facility], [facility]    [place] UA Cord Doppler  Result Date: 08/30/2021 Table formatting from the original result was not included. Images from the original result were not included.  ..an [employer] Diplomatic Services operational officer) accredited practice Center for [facility] @ [place] [address] Ordering Provider: Roma Schanz, CNM FOLLOW UP SONOGRAM Debra Glass is in the office for a follow up sonogram for BPP and cord dopplers. She is a 36 y.o. year old [id number] with Estimated  Date of Delivery: 09/30/21 by early  ultrasound now at  [redacted]w[redacted]d weeks gestation. Thus far the pregnancy has been complicated by [facility] of IUFD and preterm delivery . GESTATION: SINGLETON PRESENTATION: cephalic FETAL ACTIVITY:          Heart rate         124          The fetus is active. AMNIOTIC FLUID: The amniotic fluid volume is  normal, 15.6 cm. PLACENTA LOCALIZATION:  posterior GRADE 3 CERVIX: Limited view GESTATIONAL AGE AND  BIOMETRICS: Gestational criteria: Estimated Date of Delivery: 09/30/21 by early ultrasound now at [redacted]w[redacted]d Previous Scans:7 BIOPHYSICAL PROFILE:                                                                                                      COMMENTS GROSS BODY MOVEMENT                 2  TONE                2  RESPIRATIONS                2  AMNIOTIC FLUID                2                                                          SCORE:  8/8 (Note: NST was not performed as part of this antepartum testing)  DOPPLER FLOW STUDIES: UMBILICAL ARTERY RI RATIOS:   .61,.63,.55,.52=44% ANATOMICAL SURVEY                                                                            COMMENTS CEREBRAL VENTRICLES yes normal  CHOROID PLEXUS yes normal                              FACIAL PROFILE yes normal  4 CHAMBERED HEART yes normal  OUTFLOW TRACTS YES normaL  3VV YES NORMAL  3VTV YES NORMAL  SITUS YES NORMAL      DIAPHRAGM yes normal  STOMACH yes normal  RENAL REGION yes normal  BLADDER yes normal          3 VESSEL CORD yes normal              GENITALIA yes normal female     SUSPECTED ABNORMALITIES:  no QUALITY OF SCAN: satisfactory TECHNICIAN COMMENTS: [place] 35+3 wks,cephalic,BPP 8/8,posterior placenta gr 3,RI .61,.63,.55,.52=44%,AFI 15.6 cm,FHR 124 bpm A copy of this report including all images has been saved and backed up to a second  source for retrieval if needed. All measures and details of the anatomical scan, placentation, fluid volume and pelvic anatomy are contained in that report. Amber Heide Guile  08/29/2021 12:10 PM Clinical Impression and recommendations: I have reviewed the sonogram results above, combined with the patient's current clinical course, below are my impressions and any appropriate recommendations for management based on the sonographic findings. 1.  [id number] Estimated Date of Delivery: 09/30/21 by serial sonographic evaluations 2.  Fetal sonographic surveillance findings: a). Normal fluid volume b). Normal antepartum fetal assessment with BPP 8/8 c). Normal fetal Doppler ratios with consistent diastolic flow:  [id number] 3.  Normal general sonographic findings Recommend continued prenatal evaluations and care based on this sonogram and as clinically indicated from the patient's clinical course. Janyth Pupa, DO Attending [place], [place] for [employer], [facility] Course: Admitted with pp pre-e. Magneisum x 24 hours. BP meds changed to Procardia. BP improved. Headache resolved. Patient anxious to get home.   Disposition: Discharge disposition: 01-Home or Self Care       Discharged Condition: good  Discharge Instructions     Diet - low sodium heart healthy   Complete by: As directed    Increase activity slowly   Complete by: As directed       Allergies as of 09/22/2021   No Known Allergies      Medication List     STOP taking these medications    labetalol 100 MG tablet Commonly known as: NORMODYNE       TAKE these medications    furosemide 20 MG tablet Commonly known as: Lasix Take 1 tablet (20 mg total) by mouth daily for 5 days.   ibuprofen 600 MG tablet Commonly known as: ADVIL Take 1 tablet (600 mg total) by mouth every 6 (six) hours.   NIFEdipine 30 MG 24 hr tablet Commonly known as: ADALAT CC Take 1 tablet (30 mg total) by mouth 2 (two) times daily.   PRENATAL PO Take 2 tablets by mouth daily.        Follow-up Information     [facility] Follow up in 1  week(s).   Specialty: Obstetrics and Gynecology Why: blood pressure check Contact information: [address] [place] [phone]                Signed: Donnamae Jude 09/22/2021, 7:57 AM

## 2021-10-19 ENCOUNTER — Encounter: Payer: Self-pay | Admitting: Advanced Practice Midwife

## 2021-10-19 ENCOUNTER — Ambulatory Visit (INDEPENDENT_AMBULATORY_CARE_PROVIDER_SITE_OTHER): Payer: Medicaid Other | Admitting: Advanced Practice Midwife

## 2021-10-19 VITALS — BP 115/78 | HR 109 | Ht 66.0 in | Wt 151.2 lb

## 2021-10-19 DIAGNOSIS — Z3042 Encounter for surveillance of injectable contraceptive: Secondary | ICD-10-CM

## 2021-10-19 DIAGNOSIS — Z3202 Encounter for pregnancy test, result negative: Secondary | ICD-10-CM | POA: Diagnosis not present

## 2021-10-19 LAB — POCT URINE PREGNANCY: Preg Test, Ur: NEGATIVE

## 2021-10-19 MED ORDER — MEDROXYPROGESTERONE ACETATE 150 MG/ML IM SUSP: 150.0000 mg | Freq: Once | INTRAMUSCULAR | Status: DC

## 2021-10-19 MED ORDER — MEDROXYPROGESTERONE ACETATE 150 MG/ML IM SUSP
150.0000 mg | Freq: Once | INTRAMUSCULAR | Status: AC
  Administered 2021-10-19: 150 mg via INTRAMUSCULAR

## 2021-10-19 NOTE — Progress Notes (Signed)
POSTPARTUM VISIT Patient name: Debra Glass MRN [id number]  Date of birth: June 26, 1985 Chief Complaint:   Postpartum Care  History of Present Illness:   Debra Glass is a 36 y.o. [phone] African American female being seen today for a postpartum visit. She is 5 weeks postpartum following a vaginal birth after cesarean (VBAC) at 37.5 gestational weeks. IOL: yes, for gestational hypertension . Anesthesia: epidural.  Laceration: bilat labial, R periurethral (unclear if repaired).  Complications: was readmitted on PPD#7-8 for mag sulfate due to pre-e w severe features. Inpatient contraception: no.   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 Tobacco use: former . Substance use disorder: no. Last pap smear: Feb 2022 and results were NILM w/ HRHPV negative. Next pap smear due: Feb 2025 No LMP recorded.  Postpartum course has been uncomplicated. Bleeding none. Bowel function is normal. Bladder function is normal. Urinary incontinence? no, fecal incontinence? no Patient is not sexually active. Last sexual activity: prior to birth of baby. Desired contraception:  Depo bridge to outpt BTL . Patient does not want a pregnancy in the future.  Desired family size is 3 children.   Upstream - 10/19/21 0936       Pregnancy Intention Screening   Does the patient want to become pregnant in the next year? No    Does the patient's partner want to become pregnant in the next year? No    Would the patient like to discuss contraceptive options today? Yes      Contraception Wrap Up   Current Method No Method - Other Reason    Contraception Counseling Provided Yes            The pregnancy intention screening data noted above was reviewed. Potential methods of contraception were discussed. The patient elected to proceed with No data recorded.  Edinburgh Postpartum Depression Screening: negative  Edinburgh Postnatal Depression Scale - 10/19/21 0934       Edinburgh Postnatal  Depression Scale:  In the Past 7 Days   I have been able to laugh and see the funny side of things. 0    I have looked forward with enjoyment to things. 0    I have blamed myself unnecessarily when things went wrong. 0    I have been anxious or worried for no good reason. 0    I have felt scared or panicky for no good reason. 0    Things have been getting on top of me. 0    I have been so unhappy that I have had difficulty sleeping. 0    I have felt sad or miserable. 0    I have been so unhappy that I have been crying. 0    The thought of harming myself has occurred to me. 0    Edinburgh Postnatal Depression Scale Total 0                07/11/2021    9:49 AM 03/18/2021   11:05 AM 05/26/2020    9:02 AM 02/13/2020   10:19 AM  GAD 7 : Generalized Anxiety Score  Nervous, Anxious, on Edge 0 0 0 0  Control/stop worrying 0 0 0 0  Worry too much - different things 0 0 0 0  Trouble relaxing 0 0 0 0  Restless 0 0 0 0  Easily annoyed or irritable 0 0 0 0  Afraid - awful might happen 0 0 0 0  Total GAD 7 Score 0 0  0 0     Baby's course has been uncomplicated. Baby is feeding by bottle. Infant has a pediatrician/family doctor? Yes.  Childcare strategy if returning to work/school: family.  Pt has material needs met for her and baby: Yes.   Review of Systems:   Pertinent items are noted in HPI Denies Abnormal vaginal discharge w/ itching/odor/irritation, headaches, visual changes, shortness of breath, chest pain, abdominal pain, severe nausea/vomiting, or problems with urination or bowel movements. Pertinent History Reviewed:  Reviewed past medical,surgical, obstetrical and family history.  Reviewed problem list, medications and allergies. OB History  Gravida Para Term Preterm AB Living  5 4 1 3 1 3  SAB IAB Ectopic Multiple Live Births  1     0 3    # Outcome Date GA Lbr Len/2nd Weight Sex Delivery Anes PTL Lv  5 Term 09/14/21 [redacted]w[redacted]d 17:13 / 00:16 6 lb 14.1 oz (3.12 kg) F VBAC EPI  LIV   4 Preterm 06/18/20 [redacted]w[redacted]d  3 lb 1 oz (1.389 kg) M CS-LTranv Gen  FD  3 Preterm 11/19/09 [redacted]w[redacted]d   F Vag-Spont   LIV  2 Preterm 07/08/08 [redacted]w[redacted]d   F Vag-Spont  N LIV     Complications: Premature rupture of membranes  1 SAB            Physical Assessment:   Vitals:   10/19/21 0930  BP: 115/78  Pulse: (!) 109  Weight: 151 lb 4 oz (68.6 kg)  Height: 5' 6" (1.676 m)  Body mass index is 24.41 kg/m.       Physical Examination:   General appearance: alert, well appearing, and in no distress  Mental status: alert, oriented to person, place, and time  Skin: warm & dry   Cardiovascular: normal heart rate noted   Respiratory: normal respiratory effort, no distress   Breasts: deferred, no complaints   Abdomen: soft, non-tender   Pelvic: examination not indicated. Thin prep pap obtained: No  Rectal: not examined  Extremities: Edema: none         Results for orders placed or performed in visit on 10/19/21 (from the past 24 hour(s))  POCT urine pregnancy   Collection Time: 10/19/21 10:05 AM  Result Value Ref Range   Preg Test, Ur Negative Negative    Assessment & Plan:  1) Postpartum exam 2) Five wks s/p vaginal birth after cesarean (VBAC) 3) bottle feeding 4) Depression screening: negative 5) Contraception management: desires outpatient BTL- resigned 30d papers today (are under media 8/28) and MD pre-op appt; no sex since delivery and desires Depo today  6) cHTN: stable on ProcardiaXL 30mg  Essential components of care per ACOG recommendations:  1.  Mood and well being:  If positive depression screen, discussed and plan developed.  If using tobacco we discussed reduction/cessation and risk of relapse If current substance abuse, we discussed and referral to local resources was offered.   2. Infant care and feeding:  If breastfeeding, discussed returning to work, pumping, breastfeeding-associated pain, guidance regarding return to fertility while lactating if not using another method.  If needed, patient was provided with a letter to be allowed to pump q 2-3hrs to support lactation in a private location with access to a refrigerator to store breastmilk.   Recommended that all caregivers be immunized for flu, pertussis and other preventable communicable diseases If pt does not have material needs met for her/baby, referred to local resources for help obtaining these.  3. Sexuality, contraception and birth spacing Provided guidance regarding   sexuality, management of dyspareunia, and resumption of intercourse Discussed avoiding interpregnancy interval <6mths and recommended birth spacing of 18 months  4. Sleep and fatigue Discussed coping options for fatigue and sleep disruption Encouraged family/partner/community support of 4 hrs of uninterrupted sleep to help with mood and fatigue  5. Physical recovery  If pt had a C/S, assessed incisional pain and providing guidance on normal vs prolonged recovery If pt had a laceration, perineal healing and pain reviewed.  If urinary or fecal incontinence, discussed management and referred to PT or uro/gyn if indicated  Patient is safe to resume physical activity. Discussed attainment of healthy weight.  6.  Chronic disease management Discussed pregnancy complications if any, and their implications for future childbearing and long-term maternal health. Review recommendations for prevention of recurrent pregnancy complications, such as 17 hydroxyprogesterone caproate to reduce risk for recurrent PTB not applicable, or aspirin to reduce risk of preeclampsia yes. Pt had GDM: no. If yes, 2hr GTT scheduled: not applicable. Reviewed medications and non-pregnant dosing including consideration of whether pt is breastfeeding using a reliable resource such as LactMed: not applicable Referred for f/u w/ PCP or subspecialist providers as indicated: not applicable  7. Health maintenance Mammogram at 36yo or earlier if indicated Pap smears as  indicated  Meds:  Meds ordered this encounter  Medications   DISCONTD: medroxyPROGESTERone (DEPO-PROVERA) injection 150 mg   medroxyPROGESTERone (DEPO-PROVERA) injection 150 mg    Follow-up: Return for MD MyChart visit ASAP for pre-op tubal visit.   Orders Placed This Encounter  Procedures   POCT urine pregnancy    Kimberly D Shaw CNM 10/19/2021 2:48 PM   

## 2021-10-24 ENCOUNTER — Telehealth: Payer: Medicaid Other | Admitting: Obstetrics & Gynecology

## 2021-12-28 ENCOUNTER — Encounter: Payer: Self-pay | Admitting: Obstetrics & Gynecology

## 2022-01-04 ENCOUNTER — Other Ambulatory Visit: Payer: Self-pay | Admitting: Advanced Practice Midwife

## 2022-01-04 ENCOUNTER — Telehealth: Payer: Self-pay | Admitting: Advanced Practice Midwife

## 2022-01-04 MED ORDER — MEDROXYPROGESTERONE ACETATE 150 MG/ML IM SUSP: 150.0000 mg | INTRAMUSCULAR | 3 refills | Status: DC

## 2022-01-04 NOTE — Telephone Encounter (Signed)
Pt is requesting her depo to be sent the pharmacy on file

## 2022-01-09 ENCOUNTER — Ambulatory Visit (INDEPENDENT_AMBULATORY_CARE_PROVIDER_SITE_OTHER): Payer: Medicaid Other | Admitting: *Deleted

## 2022-01-09 DIAGNOSIS — Z3042 Encounter for surveillance of injectable contraceptive: Secondary | ICD-10-CM

## 2022-01-09 MED ORDER — MEDROXYPROGESTERONE ACETATE 150 MG/ML IM SUSP
150.0000 mg | Freq: Once | INTRAMUSCULAR | Status: AC
  Administered 2022-01-09: 150 mg via INTRAMUSCULAR

## 2022-01-09 NOTE — Progress Notes (Signed)
   NURSE VISIT- INJECTION  SUBJECTIVE:  Debra Glass is a 37 y.o. [phone] female here for a Depo Provera for contraception/period management. She is a GYN patient.   OBJECTIVE:  There were no vitals taken for this visit.  Appears well, in no apparent distress  Injection administered in: Right deltoid  Meds ordered this encounter  Medications   medroxyPROGESTERone (DEPO-PROVERA) injection 150 mg    ASSESSMENT: GYN patient Depo Provera for contraception/period management PLAN: Follow-up: in 11-13 weeks for next Depo   Levy Pupa  01/09/2022 9:37 AM

## 2022-04-03 ENCOUNTER — Ambulatory Visit (INDEPENDENT_AMBULATORY_CARE_PROVIDER_SITE_OTHER): Payer: Medicaid Other | Admitting: *Deleted

## 2022-04-03 ENCOUNTER — Ambulatory Visit: Payer: Medicaid Other

## 2022-04-03 DIAGNOSIS — Z3042 Encounter for surveillance of injectable contraceptive: Secondary | ICD-10-CM | POA: Diagnosis not present

## 2022-04-03 MED ORDER — MEDROXYPROGESTERONE ACETATE 150 MG/ML IM SUSP
150.0000 mg | Freq: Once | INTRAMUSCULAR | Status: AC
  Administered 2022-04-03: 150 mg via INTRAMUSCULAR

## 2022-04-03 NOTE — Progress Notes (Signed)
   NURSE VISIT- INJECTION  SUBJECTIVE:  Debra Glass is a 37 y.o. [phone] female here for a Depo Provera for contraception/period management. She is a GYN patient.   OBJECTIVE:  There were no vitals taken for this visit.  Appears well, in no apparent distress  Injection administered in: Left deltoid  Meds ordered this encounter  Medications   medroxyPROGESTERone (DEPO-PROVERA) injection 150 mg    ASSESSMENT: GYN patient Depo Provera for contraception/period management PLAN: Follow-up: in 11-13 weeks for next Depo   Alice Rieger  04/03/2022 2:58 PM

## 2022-05-16 ENCOUNTER — Telehealth: Payer: Self-pay

## 2022-05-16 NOTE — Telephone Encounter (Signed)
Called patient and left voicemail that we needed to reschedule her depo shot.

## 2022-06-23 ENCOUNTER — Ambulatory Visit (INDEPENDENT_AMBULATORY_CARE_PROVIDER_SITE_OTHER): Payer: Medicaid Other | Admitting: *Deleted

## 2022-06-23 DIAGNOSIS — Z3042 Encounter for surveillance of injectable contraceptive: Secondary | ICD-10-CM | POA: Diagnosis not present

## 2022-06-23 MED ORDER — MEDROXYPROGESTERONE ACETATE 150 MG/ML IM SUSP
150.0000 mg | Freq: Once | INTRAMUSCULAR | Status: AC
  Administered 2022-06-23: 150 mg via INTRAMUSCULAR

## 2022-06-23 NOTE — Progress Notes (Signed)
   NURSE VISIT- INJECTION  SUBJECTIVE:  Debra Glass is a 37 y.o. [phone] female here for a Depo Provera for contraception/period management. She is a GYN patient.   OBJECTIVE:  There were no vitals taken for this visit.  Appears well, in no apparent distress  Injection administered in: Right deltoid  Meds ordered this encounter  Medications   medroxyPROGESTERone (DEPO-PROVERA) injection 150 mg    ASSESSMENT: GYN patient Depo Provera for contraception/period management PLAN: Follow-up: in 11-13 weeks for next Depo   [name]  06/23/2022 9:04 AM

## 2022-06-26 ENCOUNTER — Ambulatory Visit: Payer: Medicaid Other

## 2022-09-15 ENCOUNTER — Ambulatory Visit (INDEPENDENT_AMBULATORY_CARE_PROVIDER_SITE_OTHER): Payer: Medicaid Other | Admitting: *Deleted

## 2022-09-15 DIAGNOSIS — Z3042 Encounter for surveillance of injectable contraceptive: Secondary | ICD-10-CM

## 2022-09-15 MED ORDER — MEDROXYPROGESTERONE ACETATE 150 MG/ML IM SUSP
150.0000 mg | Freq: Once | INTRAMUSCULAR | Status: AC
  Administered 2022-09-15: 150 mg via INTRAMUSCULAR

## 2022-09-15 NOTE — Progress Notes (Signed)
   NURSE VISIT- INJECTION  SUBJECTIVE:  Debra Glass is a 37 y.o. [phone] female here for a Depo Provera for contraception/period management. She is a GYN patient.   OBJECTIVE:  There were no vitals taken for this visit.  Appears well, in no apparent distress  Injection administered in: Left deltoid  Meds ordered this encounter  Medications   medroxyPROGESTERone (DEPO-PROVERA) injection 150 mg    ASSESSMENT: GYN patient Depo Provera for contraception/period management PLAN: Follow-up: in 11-13 weeks for next Depo   [name]  09/15/2022 10:06 AM

## 2022-12-08 ENCOUNTER — Other Ambulatory Visit: Payer: Self-pay | Admitting: Advanced Practice Midwife

## 2022-12-08 ENCOUNTER — Ambulatory Visit: Payer: Medicaid Other

## 2022-12-11 ENCOUNTER — Ambulatory Visit (INDEPENDENT_AMBULATORY_CARE_PROVIDER_SITE_OTHER): Payer: Medicaid Other | Admitting: *Deleted

## 2022-12-11 DIAGNOSIS — Z3042 Encounter for surveillance of injectable contraceptive: Secondary | ICD-10-CM | POA: Diagnosis not present

## 2022-12-11 MED ORDER — MEDROXYPROGESTERONE ACETATE 150 MG/ML IM SUSP
150.0000 mg | Freq: Once | INTRAMUSCULAR | Status: AC
  Administered 2022-12-11: 150 mg via INTRAMUSCULAR

## 2022-12-11 NOTE — Progress Notes (Signed)
   NURSE VISIT- INJECTION  SUBJECTIVE:  Debra Glass is a 37 y.o. [phone] female here for a Depo Provera for contraception/period management. She is a GYN patient.   OBJECTIVE:  There were no vitals taken for this visit.  Appears well, in no apparent distress  Injection administered in: Right deltoid  Meds ordered this encounter  Medications   medroxyPROGESTERone (DEPO-PROVERA) injection 150 mg    ASSESSMENT: GYN patient Depo Provera for contraception/period management PLAN: Follow-up: in 11-13 weeks for next Depo   [name]  12/11/2022 4:25 PM

## 2023-03-05 ENCOUNTER — Ambulatory Visit: Payer: Medicaid Other | Admitting: *Deleted

## 2023-03-05 DIAGNOSIS — Z3042 Encounter for surveillance of injectable contraceptive: Secondary | ICD-10-CM | POA: Diagnosis not present

## 2023-03-05 MED ORDER — MEDROXYPROGESTERONE ACETATE 150 MG/ML IM SUSY
150.0000 mg | PREFILLED_SYRINGE | Freq: Once | INTRAMUSCULAR | Status: AC
  Administered 2023-03-05: 150 mg via INTRAMUSCULAR

## 2023-03-05 NOTE — Progress Notes (Signed)
   NURSE VISIT- INJECTION  SUBJECTIVE:  Debra Glass is a 37 y.o. [phone] female here for a Depo Provera for contraception/period management. She is a GYN patient.   OBJECTIVE:  There were no vitals taken for this visit.  Appears well, in no apparent distress  Injection administered in: Left deltoid  Meds ordered this encounter  Medications   medroxyPROGESTERone Acetate SUSY 150 mg    ASSESSMENT: GYN patient Depo Provera for contraception/period management PLAN: Follow-up: in 11-13 weeks for next Depo. Schedule pap/physical before next Depo is due.    Malachy Mood  03/05/2023 9:34 AM

## 2023-05-29 ENCOUNTER — Ambulatory Visit

## 2023-05-29 ENCOUNTER — Telehealth: Payer: Self-pay | Admitting: Obstetrics & Gynecology

## 2023-05-29 MED ORDER — MEDROXYPROGESTERONE ACETATE 150 MG/ML IM SUSP: 150.0000 mg | INTRAMUSCULAR | 3 refills | Status: AC

## 2023-05-29 NOTE — Addendum Note (Signed)
 Addended by: Liviya Santini H on: 05/29/2023 03:43 PM   Modules accepted: Orders

## 2023-05-29 NOTE — Telephone Encounter (Signed)
 Patient calling stating that her depo is waiting on approval from provider

## 2023-06-05 ENCOUNTER — Ambulatory Visit

## 2023-06-05 DIAGNOSIS — Z3042 Encounter for surveillance of injectable contraceptive: Secondary | ICD-10-CM | POA: Diagnosis not present

## 2023-06-05 MED ORDER — MEDROXYPROGESTERONE ACETATE 150 MG/ML IM SUSP
150.0000 mg | Freq: Once | INTRAMUSCULAR | Status: AC
  Administered 2023-06-05: 150 mg via INTRAMUSCULAR

## 2023-06-05 NOTE — Progress Notes (Signed)
   NURSE VISIT- INJECTION  SUBJECTIVE:  Debra Glass is a 38 y.o. [phone] female here for a Depo Provera  for contraception/period management. She is a GYN patient.   OBJECTIVE:  There were no vitals taken for this visit.  Appears well, in no apparent distress  Injection administered in: Right deltoid  Meds ordered this encounter  Medications   medroxyPROGESTERone  (DEPO-PROVERA ) injection 150 mg    ASSESSMENT: GYN patient Depo Provera  for contraception/period management PLAN: Follow-up: in 11-13 weeks for next Depo   Alyssa Jumper  06/05/2023 4:20 PM

## 2023-08-28 ENCOUNTER — Ambulatory Visit

## 2023-08-31 ENCOUNTER — Ambulatory Visit (INDEPENDENT_AMBULATORY_CARE_PROVIDER_SITE_OTHER): Admitting: *Deleted

## 2023-08-31 DIAGNOSIS — Z3042 Encounter for surveillance of injectable contraceptive: Secondary | ICD-10-CM

## 2023-08-31 MED ORDER — MEDROXYPROGESTERONE ACETATE 150 MG/ML IM SUSP
150.0000 mg | Freq: Once | INTRAMUSCULAR | Status: AC
  Administered 2023-08-31: 150 mg via INTRAMUSCULAR

## 2023-08-31 NOTE — Progress Notes (Signed)
   NURSE VISIT- INJECTION  SUBJECTIVE:  Debra Glass is a 38 y.o. [phone] female here for a Depo Provera  for contraception/period management. She is a GYN patient.   OBJECTIVE:  There were no vitals taken for this visit.  Appears well, in no apparent distress  Injection administered in: Left deltoid  No orders of the defined types were placed in this encounter.   ASSESSMENT: GYN patient Depo Provera  for contraception/period management PLAN: Follow-up: in 11-13 weeks for next Depo   Alan LITTIE Fischer  08/31/2023 8:59 AM

## 2023-11-23 ENCOUNTER — Ambulatory Visit

## 2023-12-03 ENCOUNTER — Ambulatory Visit: Admitting: *Deleted

## 2023-12-03 DIAGNOSIS — Z3042 Encounter for surveillance of injectable contraceptive: Secondary | ICD-10-CM

## 2023-12-03 MED ORDER — MEDROXYPROGESTERONE ACETATE 150 MG/ML IM SUSP
150.0000 mg | Freq: Once | INTRAMUSCULAR | Status: AC
  Administered 2023-12-03: 150 mg via INTRAMUSCULAR

## 2023-12-03 NOTE — Progress Notes (Signed)
   NURSE VISIT- INJECTION  SUBJECTIVE:  Debra Glass is a 38 y.o. [phone] female here for a Depo Provera  for contraception/period management. She is a GYN patient.   OBJECTIVE:  There were no vitals taken for this visit.  Appears well, in no apparent distress  Injection administered in: Right deltoid  Meds ordered this encounter  Medications   medroxyPROGESTERone  (DEPO-PROVERA ) injection 150 mg    ASSESSMENT: GYN patient Depo Provera  for contraception/period management PLAN: Follow-up: in 11-13 weeks for next Depo   Rutherford Rover  12/03/2023 9:43 AM

## 2024-02-25 ENCOUNTER — Ambulatory Visit
